# Patient Record
Sex: Male | Born: 1945 | Race: White | Hispanic: No | Marital: Married | Smoking: Former smoker
Health system: Southern US, Community
[De-identification: ages and names within clinical notes are randomized; demographics above are authoritative.]

## PROBLEM LIST (undated history)

## (undated) DIAGNOSIS — Z85820 Personal history of malignant melanoma of skin: Secondary | ICD-10-CM

## (undated) DIAGNOSIS — R51 Headache: Secondary | ICD-10-CM

## (undated) DIAGNOSIS — Z9889 Other specified postprocedural states: Secondary | ICD-10-CM

## (undated) DIAGNOSIS — R519 Headache, unspecified: Secondary | ICD-10-CM

---

## 1978-10-24 HISTORY — PX: MELANOMA EXCISION: SHX5266

## 1978-10-24 HISTORY — PX: SKIN GRAFT: SHX250

## 1998-10-24 HISTORY — PX: HERNIA REPAIR: SHX51

## 2010-10-25 ENCOUNTER — Inpatient Hospital Stay (HOSPITAL_COMMUNITY)
Admission: RE | Admit: 2010-10-25 | Discharge: 2010-10-28 | Payer: Self-pay | Source: Home / Self Care | Attending: Orthopedic Surgery | Admitting: Orthopedic Surgery

## 2010-10-27 LAB — CBC
HCT: 38.3 % — ABNORMAL LOW (ref 39.0–52.0)
Hemoglobin: 12.7 g/dL — ABNORMAL LOW (ref 13.0–17.0)
MCH: 30.6 pg (ref 26.0–34.0)
MCHC: 33.2 g/dL (ref 30.0–36.0)
MCV: 92.3 fL (ref 78.0–100.0)
Platelets: 220 10*3/uL (ref 150–400)
RBC: 4.15 MIL/uL — ABNORMAL LOW (ref 4.22–5.81)
RDW: 13 % (ref 11.5–15.5)
WBC: 16.7 10*3/uL — ABNORMAL HIGH (ref 4.0–10.5)

## 2010-10-27 LAB — BASIC METABOLIC PANEL
BUN: 9 mg/dL (ref 6–23)
CO2: 26 mEq/L (ref 19–32)
Calcium: 8.6 mg/dL (ref 8.4–10.5)
Chloride: 100 mEq/L (ref 96–112)
Creatinine, Ser: 0.97 mg/dL (ref 0.4–1.5)
GFR calc Af Amer: >60 mL/min (ref >60)
GFR calc non Af Amer: >60 mL/min (ref >60)
Glucose, Bld: 167 mg/dL — ABNORMAL HIGH (ref 70–99)
Potassium: 4.1 mEq/L (ref 3.5–5.1)
Sodium: 133 mEq/L — ABNORMAL LOW (ref 135–145)

## 2010-10-28 LAB — CBC
HCT: 36.3 % — ABNORMAL LOW (ref 39.0–52.0)
Hemoglobin: 12.2 g/dL — ABNORMAL LOW (ref 13.0–17.0)
MCH: 31.2 pg (ref 26.0–34.0)
MCHC: 33.6 g/dL (ref 30.0–36.0)
MCV: 92.8 fL (ref 78.0–100.0)
Platelets: 229 10*3/uL (ref 150–400)
RBC: 3.91 MIL/uL — ABNORMAL LOW (ref 4.22–5.81)
RDW: 13.1 % (ref 11.5–15.5)
WBC: 15.1 10*3/uL — ABNORMAL HIGH (ref 4.0–10.5)

## 2010-10-28 LAB — BASIC METABOLIC PANEL
BUN: 14 mg/dL (ref 6–23)
CO2: 29 mEq/L (ref 19–32)
Calcium: 8.9 mg/dL (ref 8.4–10.5)
Chloride: 98 mEq/L (ref 96–112)
Creatinine, Ser: 0.95 mg/dL (ref 0.4–1.5)
GFR calc Af Amer: >60 mL/min (ref >60)
GFR calc non Af Amer: >60 mL/min (ref >60)
Glucose, Bld: 154 mg/dL — ABNORMAL HIGH (ref 70–99)
Potassium: 4 mEq/L (ref 3.5–5.1)
Sodium: 136 mEq/L (ref 135–145)

## 2010-11-24 NOTE — Discharge Summary (Signed)
NAME:  Joshua Hughes, Joshua Hughes               ACCOUNT NO.:  [id number]  MEDICAL RECORD NO.:  [id number]          PATIENT TYPE:  INP  LOCATION:  1620                         FACILITY:  [facility]  PHYSICIAN:  Ollen Gross, M.D.    DATE OF BIRTH:  Dec 08, 1945  DATE OF ADMISSION:  10/25/2010 DATE OF DISCHARGE:  10/28/2010                              DISCHARGE SUMMARY   ADMITTING DIAGNOSES: 1. Osteoarthritis, right greater than left knee. 2. Hyperlipidemia. 3. Left arm melanoma. 4. Childhood illnesses of measles and mumps.  DISCHARGE DIAGNOSES: 1. Osteoarthritis of bilateral knees status post right total knee     replacement arthroplasty with a left knee cortisone injection. 2. Postop hyponatremia, improved. 3. Remaining discharge diagnoses same as admitting diagnoses.  PROCEDURE:  October 25, 2010: Right total knee with a left knee cortisone injection.  Surgeon: Dr. Lequita Halt.  Assistant: Avel Peace, PA-C. Spinal anesthesia.  Tourniquet time: 37 minutes.  CONSULTS:  None.  BRIEF HISTORY:  The patient is 65 year old male with end-stage arthritis and right greater than left knee pain with worsening pain and dysfunction who failed other management and now presents for a total knee arthroplasty.  LABORATORY DATA:  Preop CBC showed a hemoglobin of 16, hematocrit of 47.7, white cell count of 7.7, platelets 249.  Postop hemoglobin 13.2 and 12.7.  Last H&H 12.2 and 36.9.  PT/INR 13.8 and 1.04 with PTT of 32. Chemistry panel on admission all within normal limits.  Serial BMETs were followed.  Sodium did drop from 142 down to 133.  It was back up to 136.  The remaining electrolytes remained within normal limits.  Preop UA negative.  Blood group type A+.  Nasal swabs were negative for Staphylococcus aureus and negative for MRSA.  Two-view chest on October 14, 2010 no acute cardiopulmonary disease.  HOSPITAL COURSE:  The patient was admitted to [facility], taken to the OR and underwent  above-stated procedure without any complication.  The patient tolerated the procedure well, and later transferred to recovery room on orthopedic floor and started on p.o. and IV analgesic pain control following surgery.  Actually was doing fairly well on the morning of day one and was able to get some sleep. Hemoglobin looked good.  I put him on Xarelto for DVT prophylaxis. Hemovac drain pulled without difficulty.  Started getting up out of bed on day one.  By day two, he was starting to progress with therapy, tolerating his medications.  Sodium was down a little bit and felt to be a dilutional component postoperatively.  He was diuresing his fluids well, and thus we allowed him to concentrate his sodium back up on his own.  He continued to progress well, and by day three, sodium was back up.  Hemoglobin was stable.  Incision looked good, tolerating his medications and was discharged home.  DISCHARGE/PLAN: 1. The patient was discharged home on October 28, 2010. 2. Discharge diagnoses:  Please see above. 3. Discharge medications:  Percocet, Robaxin, Xarelto, Crestor.  DIET:  Heart-healthy diet.  ACTIVITY:  He is weightbearing as tolerated on right lower extremity. PT home health nursing.  FOLLOW-UP:  2 weeks.  DISPOSITION:  Home.  CONDITION ON DISCHARGE:  Improved.     Joshua Hughes, P.A.C.   ______________________________ Ollen Gross, M.D.    ALP/MEDQ  D:  11/11/2010  T:  11/11/2010  Job:  [id number]  cc:   Lianne Bushy, M.D. Fax: [phone]  Electronically Signed by Patrica Duel P.A.C. on 11/12/2010 08:53:18 AM Electronically Signed by Ollen Gross M.D. on 11/24/2010 03:33:41 PM

## 2010-12-24 ENCOUNTER — Other Ambulatory Visit: Payer: Self-pay | Admitting: Orthopedic Surgery

## 2010-12-24 ENCOUNTER — Encounter (HOSPITAL_COMMUNITY): Payer: BC Managed Care – PPO

## 2010-12-24 DIAGNOSIS — Z01812 Encounter for preprocedural laboratory examination: Secondary | ICD-10-CM | POA: Insufficient documentation

## 2010-12-24 DIAGNOSIS — M25669 Stiffness of unspecified knee, not elsewhere classified: Secondary | ICD-10-CM | POA: Insufficient documentation

## 2010-12-24 LAB — COMPREHENSIVE METABOLIC PANEL
ALT: 21 U/L (ref 0–53)
AST: 21 U/L (ref 0–37)
Albumin: 3.9 g/dL (ref 3.5–5.2)
Alkaline Phosphatase: 59 U/L (ref 39–117)
BUN: 21 mg/dL (ref 6–23)
CO2: 28 mEq/L (ref 19–32)
Calcium: 9.5 mg/dL (ref 8.4–10.5)
Chloride: 105 mEq/L (ref 96–112)
Creatinine, Ser: 0.9 mg/dL (ref 0.4–1.5)
GFR calc Af Amer: >60 mL/min (ref >60)
GFR calc non Af Amer: >60 mL/min (ref >60)
Glucose, Bld: 152 mg/dL — ABNORMAL HIGH (ref 70–99)
Potassium: 3.8 mEq/L (ref 3.5–5.1)
Sodium: 142 mEq/L (ref 135–145)
Total Bilirubin: 0.8 mg/dL (ref 0.3–1.2)
Total Protein: 7.6 g/dL (ref 6.0–8.3)

## 2010-12-24 LAB — PROTIME-INR
INR: 1.04 (ref 0.00–1.49)
Prothrombin Time: 13.8 seconds (ref 11.6–15.2)

## 2010-12-24 LAB — URINALYSIS, ROUTINE W REFLEX MICROSCOPIC
Bilirubin Urine: NEGATIVE
Hgb urine dipstick: NEGATIVE
Ketones, ur: NEGATIVE mg/dL
Nitrite: NEGATIVE
Protein, ur: NEGATIVE mg/dL
Specific Gravity, Urine: 1.03 (ref 1.005–1.030)
Urine Glucose, Fasting: NEGATIVE mg/dL
Urobilinogen, UA: 0.2 mg/dL (ref 0.0–1.0)
pH: 5.5 (ref 5.0–8.0)

## 2010-12-24 LAB — CBC
HCT: 44.9 % (ref 39.0–52.0)
Hemoglobin: 14.8 g/dL (ref 13.0–17.0)
MCH: 30.3 pg (ref 26.0–34.0)
MCHC: 33 g/dL (ref 30.0–36.0)
MCV: 92 fL (ref 78.0–100.0)
Platelets: 275 10*3/uL (ref 150–400)
RBC: 4.88 MIL/uL (ref 4.22–5.81)
RDW: 13 % (ref 11.5–15.5)
WBC: 8.7 10*3/uL (ref 4.0–10.5)

## 2010-12-24 LAB — SURGICAL PCR SCREEN
MRSA, PCR: NEGATIVE
Staphylococcus aureus: NEGATIVE

## 2010-12-24 LAB — APTT: aPTT: 30 seconds (ref 24–37)

## 2010-12-29 ENCOUNTER — Ambulatory Visit (HOSPITAL_COMMUNITY)
Admission: RE | Admit: 2010-12-29 | Discharge: 2010-12-29 | Disposition: A | Discharge status: Home / Self Care | Payer: BC Managed Care – PPO | Source: Ambulatory Visit | Attending: Orthopedic Surgery | Admitting: Orthopedic Surgery

## 2010-12-29 DIAGNOSIS — Z01818 Encounter for other preprocedural examination: Secondary | ICD-10-CM | POA: Insufficient documentation

## 2010-12-29 DIAGNOSIS — Z96659 Presence of unspecified artificial knee joint: Secondary | ICD-10-CM | POA: Insufficient documentation

## 2010-12-29 DIAGNOSIS — M24669 Ankylosis, unspecified knee: Secondary | ICD-10-CM | POA: Insufficient documentation

## 2010-12-29 DIAGNOSIS — Z01812 Encounter for preprocedural laboratory examination: Secondary | ICD-10-CM | POA: Insufficient documentation

## 2010-12-29 DIAGNOSIS — M2469 Ankylosis, other specified joint: Secondary | ICD-10-CM | POA: Insufficient documentation

## 2010-12-30 NOTE — Op Note (Signed)
  NAME:  Joshua Hughes, Joshua Hughes               ACCOUNT NO.:  [id number]  MEDICAL RECORD NO.:  [id number]           PATIENT TYPE:  O  LOCATION:  DAYL                         [facility]ACILITY:  [facility]  PHYSICIAN:  Ollen Gross, M.D.    DATE OF BIRTH:  [birth year]  DATE OF PROCEDURE:  12/29/2010 DATE OF DISCHARGE:                              OPERATIVE REPORT   PREOPERATIVE DIAGNOSIS:  Right knee arthrofibrosis.  POSTOPERATIVE DIAGNOSIS:  Right knee arthrofibrosis.  PROCEDURE:  Right knee closed manipulation.  SURGEON:  Ollen Gross, M.D.  ASSISTANT.:  None.  ANESTHESIA:  General.  Premanipulation range of motion 0 to 90.  Post manipulation range of motion 0 to 125.  COMPLICATIONS:  None.  CONDITION.:  Stable to recovery.  BRIEF CLINICAL NOTE:  [name] is a 65 year old male who had right total knee arthroplasty done approximately 2 months ago.  He has been progressing very slowly with range of motion and has not had any improvement in the past month.  He has been stuck at 90 degrees despite efforts to improve this with physical therapy.  He presents now for closed manipulation.  PROCEDURE IN DETAIL:  After successful administration of general anesthetic, exam under anesthesia was performed, range of motion 0 to 90.  I then pushed my chest up against his proximal tibia, flexing the knee with audible lysis of adhesions.  Was easily able to achieve 125 degrees of flexion.  Full extension was also maintained.  I flexed him again and he made it to 125.  He was then awakened and transported to recovery in stable condition.     Ollen Gross, M.D.     FA/MEDQ  D:  12/29/2010  T:  12/29/2010  Job:  [id number]  Electronically Signed by Ollen Gross M.D. on 12/29/2010 03:52:06 PM

## 2011-01-03 LAB — CBC
HCT: 39.7 % (ref 39.0–52.0)
HCT: 47.7 % (ref 39.0–52.0)
Hemoglobin: 13.2 g/dL (ref 13.0–17.0)
Hemoglobin: 16 g/dL (ref 13.0–17.0)
MCH: 30.7 pg (ref 26.0–34.0)
MCH: 31.2 pg (ref 26.0–34.0)
MCHC: 33.2 g/dL (ref 30.0–36.0)
MCHC: 33.5 g/dL (ref 30.0–36.0)
MCV: 92.3 fL (ref 78.0–100.0)
MCV: 93 fL (ref 78.0–100.0)
Platelets: 234 10*3/uL (ref 150–400)
Platelets: 249 10*3/uL (ref 150–400)
RBC: 4.3 MIL/uL (ref 4.22–5.81)
RBC: 5.13 MIL/uL (ref 4.22–5.81)
RDW: 12.9 % (ref 11.5–15.5)
RDW: 12.9 % (ref 11.5–15.5)
WBC: 13.4 10*3/uL — ABNORMAL HIGH (ref 4.0–10.5)
WBC: 7.7 10*3/uL (ref 4.0–10.5)

## 2011-01-03 LAB — URINALYSIS, ROUTINE W REFLEX MICROSCOPIC
Bilirubin Urine: NEGATIVE
Glucose, UA: NEGATIVE mg/dL
Hgb urine dipstick: NEGATIVE
Ketones, ur: NEGATIVE mg/dL
Nitrite: NEGATIVE
Protein, ur: NEGATIVE mg/dL
Specific Gravity, Urine: 1.018 (ref 1.005–1.030)
Urobilinogen, UA: 0.2 mg/dL (ref 0.0–1.0)
pH: 5.5 (ref 5.0–8.0)

## 2011-01-03 LAB — COMPREHENSIVE METABOLIC PANEL
ALT: 22 U/L (ref 0–53)
AST: 22 U/L (ref 0–37)
Albumin: 4.1 g/dL (ref 3.5–5.2)
Alkaline Phosphatase: 64 U/L (ref 39–117)
BUN: 20 mg/dL (ref 6–23)
CO2: 29 mEq/L (ref 19–32)
Calcium: 9.6 mg/dL (ref 8.4–10.5)
Chloride: 105 mEq/L (ref 96–112)
Creatinine, Ser: 0.91 mg/dL (ref 0.4–1.5)
GFR calc Af Amer: >60 mL/min (ref >60)
GFR calc non Af Amer: >60 mL/min (ref >60)
Glucose, Bld: 117 mg/dL — ABNORMAL HIGH (ref 70–99)
Potassium: 4.7 mEq/L (ref 3.5–5.1)
Sodium: 142 mEq/L (ref 135–145)
Total Bilirubin: 1 mg/dL (ref 0.3–1.2)
Total Protein: 7.6 g/dL (ref 6.0–8.3)

## 2011-01-03 LAB — SURGICAL PCR SCREEN
MRSA, PCR: NEGATIVE
Staphylococcus aureus: NEGATIVE

## 2011-01-03 LAB — BASIC METABOLIC PANEL
BUN: 11 mg/dL (ref 6–23)
CO2: 29 mEq/L (ref 19–32)
Calcium: 8.2 mg/dL — ABNORMAL LOW (ref 8.4–10.5)
Chloride: 98 mEq/L (ref 96–112)
Creatinine, Ser: 0.99 mg/dL (ref 0.4–1.5)
GFR calc Af Amer: >60 mL/min (ref >60)
GFR calc non Af Amer: >60 mL/min (ref >60)
Glucose, Bld: 156 mg/dL — ABNORMAL HIGH (ref 70–99)
Potassium: 4.5 mEq/L (ref 3.5–5.1)
Sodium: 134 mEq/L — ABNORMAL LOW (ref 135–145)

## 2011-01-03 LAB — TYPE AND SCREEN
ABO/RH(D): A POS
Antibody Screen: NEGATIVE

## 2011-01-03 LAB — APTT: aPTT: 32 seconds (ref 24–37)

## 2011-01-03 LAB — PROTIME-INR
INR: 1.04 (ref 0.00–1.49)
Prothrombin Time: 13.8 seconds (ref 11.6–15.2)

## 2011-01-03 LAB — ABO/RH: ABO/RH(D): A POS

## 2011-10-25 HISTORY — PX: JOINT REPLACEMENT: SHX530

## 2012-07-22 IMAGING — CR DG CHEST 2V
2 series · 2 of 2 positions shown · non-contrast
Comparison: None

CLINICAL DATA: Osteoarthritis right knee.

CHEST - 2 VIEW

[w chest pa]
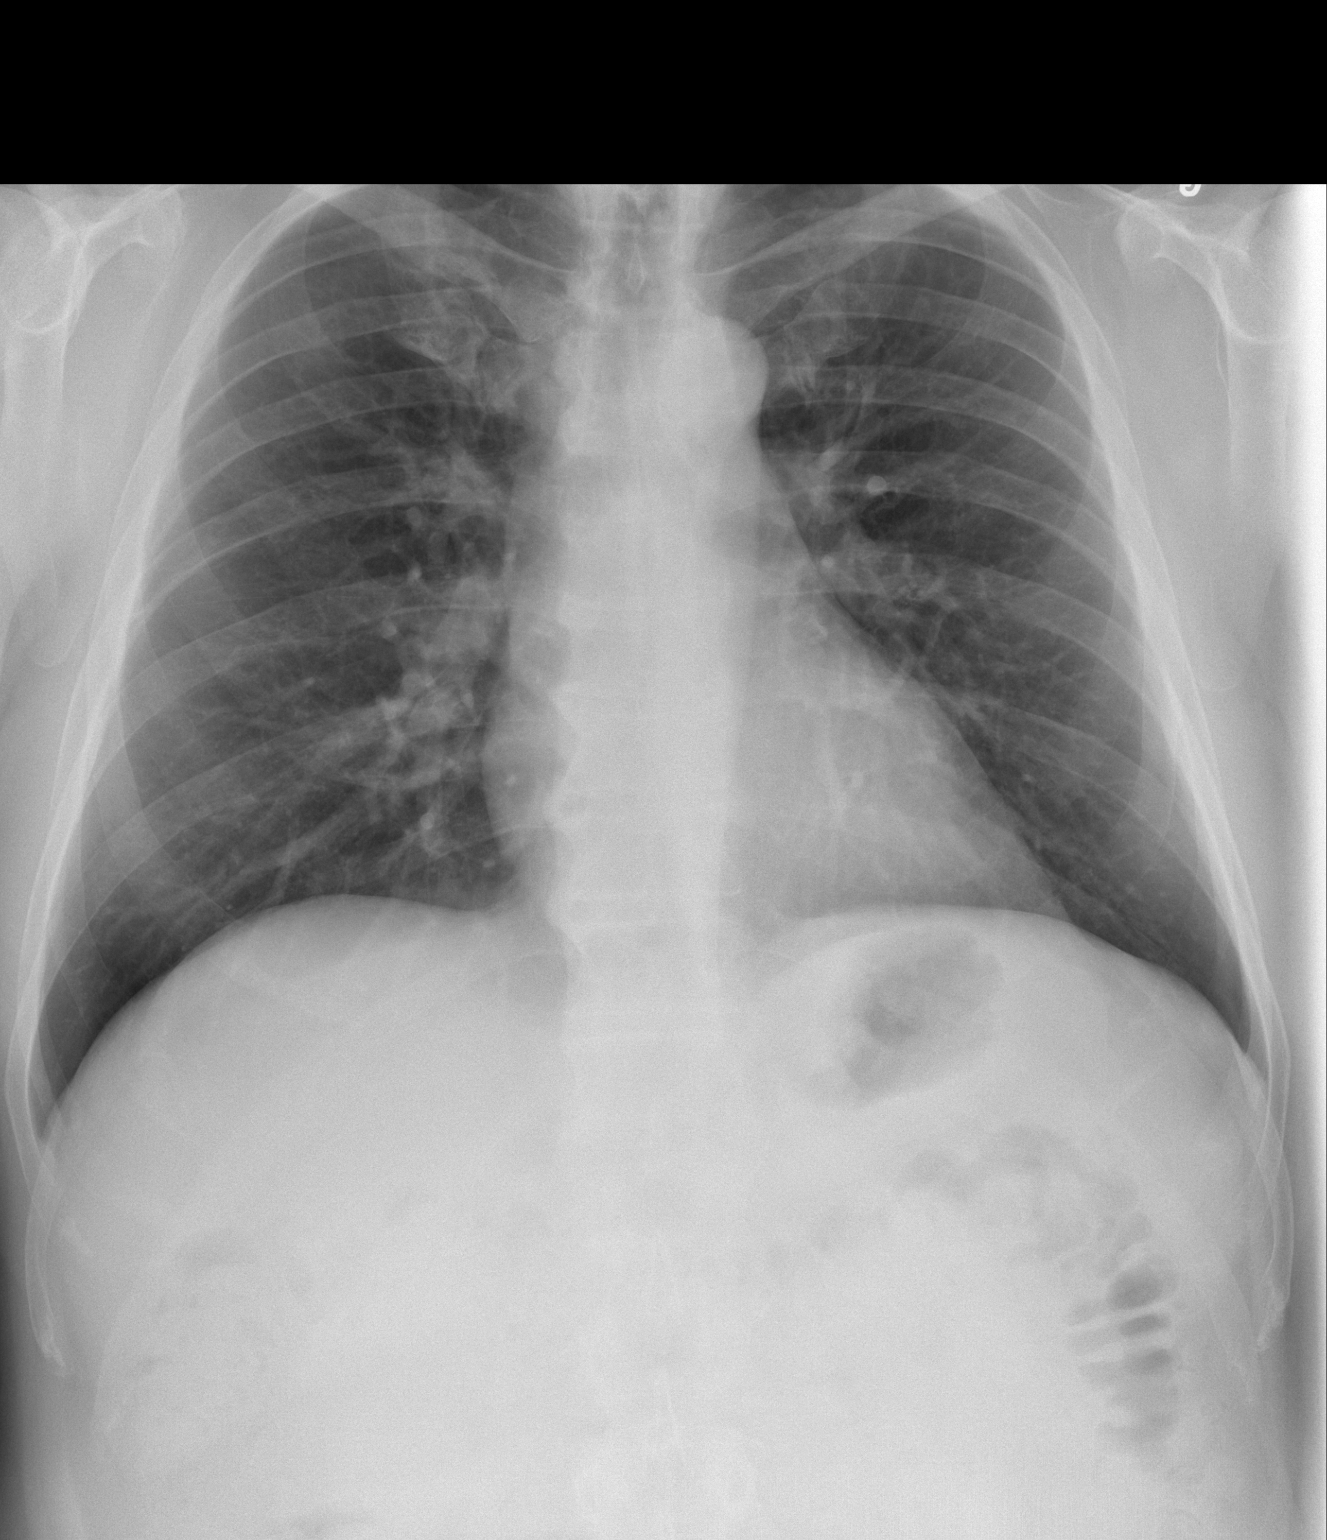

[w chest lat]
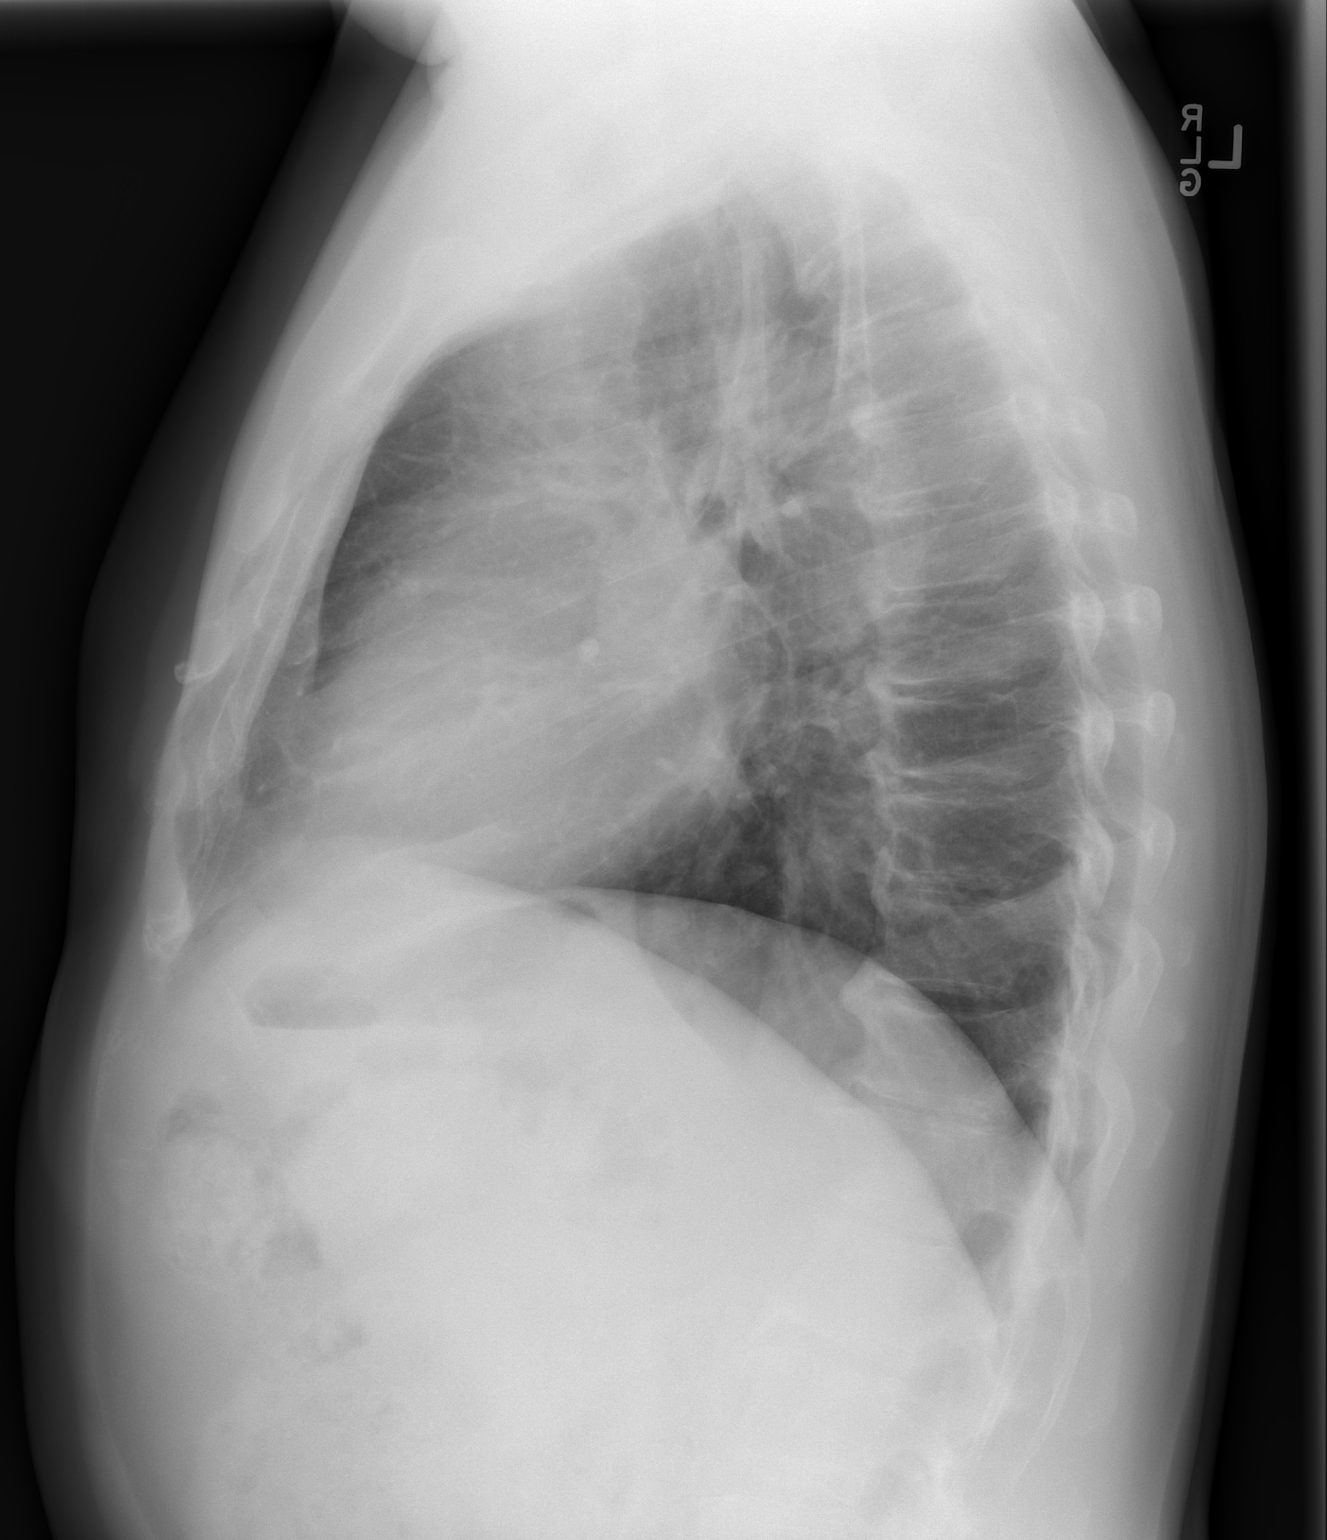

[2 of 2 positions shown; findings below may reference images not displayed]

FINDINGS: Heart size is normal.  Negative for heart failure.  The
lungs are clear without infiltrate or effusion.
IMPRESSION: No active cardiopulmonary disease.

## 2014-09-15 ENCOUNTER — Ambulatory Visit: Payer: Self-pay | Admitting: Orthopedic Surgery

## 2014-09-15 NOTE — Progress Notes (Signed)
Preoperative surgical orders have been place into the Epic hospital system for [facility] on 09/15/2014, 7:41 AM  by Mickel Crow for surgery on 11-19-2014.  Preop Knee Scope orders including Experal,  IV Tylenol and IV Decadron as long as there are no contraindications to the above medications. Arlee Muslim, PA-C

## 2014-11-13 ENCOUNTER — Encounter (HOSPITAL_COMMUNITY): Payer: Self-pay

## 2014-11-13 ENCOUNTER — Encounter (HOSPITAL_COMMUNITY)
Admission: RE | Admit: 2014-11-13 | Discharge: 2014-11-13 | Disposition: A | Discharge status: Home / Self Care | Payer: Medicare Other | Source: Ambulatory Visit | Attending: Orthopedic Surgery | Admitting: Orthopedic Surgery

## 2014-11-13 DIAGNOSIS — M179 Osteoarthritis of knee, unspecified: Secondary | ICD-10-CM | POA: Insufficient documentation

## 2014-11-13 DIAGNOSIS — M24661 Ankylosis, right knee: Secondary | ICD-10-CM | POA: Diagnosis present

## 2014-11-13 HISTORY — DX: Personal history of malignant melanoma of skin: Z85.820

## 2014-11-13 HISTORY — DX: Headache: R51

## 2014-11-13 HISTORY — DX: Other specified postprocedural states: Z98.890

## 2014-11-13 HISTORY — DX: Headache, unspecified: R51.9

## 2014-11-13 LAB — CBC
HCT: 47.4 % (ref 39.0–52.0)
Hemoglobin: 16.2 g/dL (ref 13.0–17.0)
MCH: 30.7 pg (ref 26.0–34.0)
MCHC: 34.2 g/dL (ref 30.0–36.0)
MCV: 89.8 fL (ref 78.0–100.0)
Platelets: 249 10*3/uL (ref 150–400)
RBC: 5.28 MIL/uL (ref 4.22–5.81)
RDW: 12.9 % (ref 11.5–15.5)
WBC: 7.7 10*3/uL (ref 4.0–10.5)

## 2014-11-13 LAB — SURGICAL PCR SCREEN
MRSA, PCR: NEGATIVE
Staphylococcus aureus: NEGATIVE

## 2014-11-13 NOTE — Progress Notes (Signed)
   11/13/14 0818  OBSTRUCTIVE SLEEP APNEA  Have you ever been diagnosed with sleep apnea through a sleep study? No  Do you snore loudly (loud enough to be heard through closed doors)?  1  Do you often feel tired, fatigued, or sleepy during the daytime? 0  Has anyone observed you stop breathing during your sleep? 0  Do you have, or are you being treated for high blood pressure? 0  BMI more than 35 kg/m2? 1  Age over 67 years old? 1  Neck circumference greater than 40 cm/16 inches? 0  Gender: 1  Obstructive Sleep Apnea Score 4  Score 4 or greater  Results sent to PCP

## 2014-11-13 NOTE — Patient Instructions (Addendum)
GAY RAPE  11/13/2014   Your procedure is scheduled on: Wednesday 11/19/14  Report to [facility] Main  Entrance and follow signs to               [place] at 2:30 PM.  Call this number if you have problems the morning of surgery [phone]   Remember:  Do not eat food :After Midnight. Clear liquids from midnight until 10:30 am on 11/19/14 then nothing.                                   You may not have any metal on your body including hair pins and              piercings  Do not wear jewelry, make-up, lotions, powders or perfumes.             Do not wear nail polish.  Do not shave  48 hours prior to surgery.              Men may shave face and neck.  Do not bring valuables to the hospital. [place].  Contacts, dentures or bridgework may not be worn into surgery.  Leave suitcase in the car. After surgery it may be brought to your room.               Please read over the following fact sheets you were given: MRSA information _____________________________________________________________________             [facility] - Preparing for Surgery Before surgery, you can play an important role.  Because skin is not sterile, your skin needs to be as free of germs as possible.  You can reduce the number of germs on your skin by washing with CHG (chlorahexidine gluconate) soap before surgery.  CHG is an antiseptic cleaner which kills germs and bonds with the skin to continue killing germs even after washing. Please DO NOT use if you have an allergy to CHG or antibacterial soaps.  If your skin becomes reddened/irritated stop using the CHG and inform your nurse when you arrive at Short Stay. Do not shave (including legs and underarms) for at least 48 hours prior to the first CHG shower.  You may shave your face/neck. Please follow these instructions carefully:  1.  Shower with CHG Soap the night before surgery  and the  morning of Surgery.  2.  If you choose to wash your hair, wash your hair first as usual with your  normal  shampoo.  3.  After you shampoo, rinse your hair and body thoroughly to remove the  shampoo.                            4.  Use CHG as you would any other liquid soap.  You can apply chg directly  to the skin and wash                       Gently with a scrungie or clean washcloth.  5.  Apply the CHG Soap to your body ONLY FROM THE NECK DOWN.   Do not use on face/ open  Wound or open sores. Avoid contact with eyes, ears mouth and genitals (private parts).                       Wash face,  Genitals (private parts) with your normal soap.             6.  Wash thoroughly, paying special attention to the area where your surgery  will be performed.  7.  Thoroughly rinse your body with warm water from the neck down.  8.  DO NOT shower/wash with your normal soap after using and rinsing off  the CHG Soap.                9.  Pat yourself dry with a clean towel.            10.  Wear clean pajamas.            11.  Place clean sheets on your bed the night of your first shower and do not  sleep with pets. Day of Surgery : Do not apply any lotions/deodorants the morning of surgery.  Please wear clean clothes to the hospital/surgery center.  FAILURE TO FOLLOW THESE INSTRUCTIONS MAY RESULT IN THE CANCELLATION OF YOUR SURGERY PATIENT SIGNATURE_________________________________  NURSE SIGNATURE__________________________________  ________________________________________________________________________   Joshua Hughes  An incentive spirometer is a tool that can help keep your lungs clear and active. This tool measures how well you are filling your lungs with each breath. Taking long deep breaths may help reverse or decrease the chance of developing breathing (pulmonary) problems (especially infection) following:  A long period of time when you are unable to move  or be active. BEFORE THE PROCEDURE   If the spirometer includes an indicator to show your best effort, your nurse or respiratory therapist will set it to a desired goal.  If possible, sit up straight or lean slightly forward. Try not to slouch.  Hold the incentive spirometer in an upright position. INSTRUCTIONS FOR USE  1. Sit on the edge of your bed if possible, or sit up as far as you can in bed or on a chair. 2. Hold the incentive spirometer in an upright position. 3. Breathe out normally. 4. Place the mouthpiece in your mouth and seal your lips tightly around it. 5. Breathe in slowly and as deeply as possible, raising the piston or the ball toward the top of the column. 6. Hold your breath for 3-5 seconds or for as long as possible. Allow the piston or ball to fall to the bottom of the column. 7. Remove the mouthpiece from your mouth and breathe out normally. 8. Rest for a few seconds and repeat Steps 1 through 7 at least 10 times every 1-2 hours when you are awake. Take your time and take a few normal breaths between deep breaths. 9. The spirometer may include an indicator to show your best effort. Use the indicator as a goal to work toward during each repetition. 10. After each set of 10 deep breaths, practice coughing to be sure your lungs are clear. If you have an incision (the cut made at the time of surgery), support your incision when coughing by placing a pillow or rolled up towels firmly against it. Once you are able to get out of bed, walk around indoors and cough well. You may stop using the incentive spirometer when instructed by your caregiver.  RISKS AND COMPLICATIONS  Take your time so you do not get  dizzy or light-headed.  If you are in pain, you may need to take or ask for pain medication before doing incentive spirometry. It is harder to take a deep breath if you are having pain. AFTER USE  Rest and breathe slowly and easily.  It can be helpful to keep track of a  log of your progress. Your caregiver can provide you with a simple table to help with this. If you are using the spirometer at home, follow these instructions: [place] IF:   You are having difficultly using the spirometer.  You have trouble using the spirometer as often as instructed.  Your pain medication is not giving enough relief while using the spirometer.  You develop fever of 100.5 F (38.1 C) or higher. SEEK IMMEDIATE MEDICAL CARE IF:   You cough up bloody sputum that had not been present before.  You develop fever of 102 F (38.9 C) or greater.  You develop worsening pain at or near the incision site. MAKE SURE YOU:   Understand these instructions.  Will watch your condition.  Will get help right away if you are not doing well or get worse. Document Released: 02/20/2007 Document Revised: 01/02/2012 Document Reviewed: 04/23/2007 ExitCare Patient Information 2014 [place].   ________________________________________________________________________   CLEAR LIQUID DIET   Foods Allowed                                                                     Foods Excluded  Coffee and tea, regular and decaf                             liquids that you cannot  Plain Jell-O in any flavor                                             see through such as: Fruit ices (not with fruit pulp)                                     milk, soups, orange juice  Iced Popsicles                                    All solid food Carbonated beverages, regular and diet                                    Cranberry, grape and apple juices Sports drinks like Gatorade Lightly seasoned clear broth or consume(fat free) Sugar, honey syrup  Sample Menu Breakfast                                Lunch  Supper Cranberry juice                    Beef broth                            Chicken broth Jell-O                                     Grape juice                            Apple juice Coffee or tea                        Jell-O                                      Popsicle                                                Coffee or tea                        Coffee or tea  _____________________________________________________________________

## 2014-11-18 DIAGNOSIS — M25669 Stiffness of unspecified knee, not elsewhere classified: Secondary | ICD-10-CM | POA: Diagnosis present

## 2014-11-18 DIAGNOSIS — Z96659 Presence of unspecified artificial knee joint: Secondary | ICD-10-CM

## 2014-11-18 DIAGNOSIS — T8489XA Other specified complication of internal orthopedic prosthetic devices, implants and grafts, initial encounter: Secondary | ICD-10-CM

## 2014-11-18 MED ORDER — DEXTROSE 5 % IV SOLN
3.0000 g | INTRAVENOUS | Status: AC
  Administered 2014-11-19: 3 g via INTRAVENOUS
  Filled 2014-11-18 (×2): qty 3000

## 2014-11-18 NOTE — H&P (Signed)
  CC- Joshua Hughes is a 69 y.o. male who presents with right knee stiffness.  HPI- . Knee Pain: Patient presents for follow up on a knee problem involving the  right knee. Onset of the symptoms was several years ago. Inciting event: He had a right total knee arthroplasty approximately 4 years ago and had post-operative stiffness requiring manipulationn. He was unable to do adequate PT post-operatively and developed stiffness again. He presented to the office 2 months ago with a severely stiff knee. His prosthesis had no sgns of loosening or failure. We discussed tretment options and he presents now for arthrotomy and scar excision with possible component  revision if vecessary. Current symptoms include stiffness.  Past Medical History  Diagnosis Date  . Headache     occasional  . H/O melanoma excision     Past Surgical History  Procedure Laterality Date  . Joint replacement Right 10/25/11    knee  . Hernia repair  2000  . Melanoma excision Left 1980    arm-skin graft  . Skin graft  1980    from hip to left arm for melanoma surgery    Prior to Admission medications   Medication Sig Start Date End Date Taking? Authorizing Provider  aspirin EC 81 MG tablet Take 81 mg by mouth daily.   Yes Historical Provider, MD  Omega-3 Fatty Acids (FISH OIL) 1000 MG CAPS Take 4,000 mg by mouth at bedtime.   Yes Historical Provider, MD   RIGHT KNEE EXAM antalgic gait, no effusion swelling or warmth, reduced range of motion (5-75 degrees), collateral ligaments intact  Physical Examination: General appearance - alert, well appearing, and in no distress Mental status - alert, oriented to person, place, and time Chest - clear to auscultation, no wheezes, rales or rhonchi, symmetric air entry Heart - normal rate, regular rhythm, normal S1, S2, no murmurs, rubs, clicks or gallops Abdomen - soft, nontender, nondistended, no masses or organomegaly Neurological - alert, oriented, normal speech, no focal  findings or movement disorder noted    Asessment/Plan--- Right knee arthrofibrosis- - Plan right knee arthrotomy with scar excision and possible component revision. Procedure risks and potential comps discussed with patient who elects to proceed. Goals are decreased pain and increased function with a high likelihood of achieving both

## 2014-11-19 ENCOUNTER — Ambulatory Visit (HOSPITAL_COMMUNITY): Payer: Medicare Other | Admitting: Certified Registered"

## 2014-11-19 ENCOUNTER — Encounter (HOSPITAL_COMMUNITY): Payer: Self-pay | Admitting: Anesthesiology

## 2014-11-19 ENCOUNTER — Observation Stay (HOSPITAL_COMMUNITY)
Admission: RE | Admit: 2014-11-19 | Discharge: 2014-11-20 | Disposition: A | Discharge status: Home / Self Care | Payer: Medicare Other | Source: Ambulatory Visit | Attending: Orthopedic Surgery | Admitting: Orthopedic Surgery

## 2014-11-19 ENCOUNTER — Encounter (HOSPITAL_COMMUNITY)
Admission: RE | Disposition: A | Discharge status: Home / Self Care | Payer: Self-pay | Source: Ambulatory Visit | Attending: Orthopedic Surgery

## 2014-11-19 DIAGNOSIS — M24661 Ankylosis, right knee: Secondary | ICD-10-CM | POA: Diagnosis present

## 2014-11-19 DIAGNOSIS — M25669 Stiffness of unspecified knee, not elsewhere classified: Secondary | ICD-10-CM | POA: Diagnosis present

## 2014-11-19 DIAGNOSIS — M1712 Unilateral primary osteoarthritis, left knee: Secondary | ICD-10-CM | POA: Diagnosis not present

## 2014-11-19 DIAGNOSIS — M25661 Stiffness of right knee, not elsewhere classified: Secondary | ICD-10-CM | POA: Diagnosis not present

## 2014-11-19 DIAGNOSIS — Z7982 Long term (current) use of aspirin: Secondary | ICD-10-CM | POA: Insufficient documentation

## 2014-11-19 DIAGNOSIS — Z96659 Presence of unspecified artificial knee joint: Secondary | ICD-10-CM

## 2014-11-19 DIAGNOSIS — Z85820 Personal history of malignant melanoma of skin: Secondary | ICD-10-CM | POA: Insufficient documentation

## 2014-11-19 DIAGNOSIS — Z6836 Body mass index (BMI) 36.0-36.9, adult: Secondary | ICD-10-CM | POA: Insufficient documentation

## 2014-11-19 DIAGNOSIS — E669 Obesity, unspecified: Secondary | ICD-10-CM | POA: Diagnosis not present

## 2014-11-19 DIAGNOSIS — T8489XA Other specified complication of internal orthopedic prosthetic devices, implants and grafts, initial encounter: Secondary | ICD-10-CM

## 2014-11-19 DIAGNOSIS — Z87891 Personal history of nicotine dependence: Secondary | ICD-10-CM | POA: Diagnosis not present

## 2014-11-19 HISTORY — PX: KNEE ARTHROTOMY: SHX5881

## 2014-11-19 SURGERY — ARTHROTOMY, KNEE
Anesthesia: General | Site: Knee | Laterality: Right

## 2014-11-19 MED ORDER — OXYCODONE HCL 5 MG PO TABS
5.0000 mg | ORAL_TABLET | ORAL | Status: DC | PRN
  Administered 2014-11-19 – 2014-11-20 (×4): 10 mg via ORAL
  Filled 2014-11-19 (×4): qty 2

## 2014-11-19 MED ORDER — METHOCARBAMOL 500 MG PO TABS
500.0000 mg | ORAL_TABLET | Freq: Four times a day (QID) | ORAL | Status: DC | PRN
Start: 2014-11-19 — End: 2014-11-20

## 2014-11-19 MED ORDER — METOCLOPRAMIDE HCL 5 MG/ML IJ SOLN: 5.0000 mg | Freq: Three times a day (TID) | INTRAMUSCULAR | Status: DC | PRN

## 2014-11-19 MED ORDER — MORPHINE SULFATE 2 MG/ML IJ SOLN
1.0000 mg | INTRAMUSCULAR | Status: DC | PRN
  Administered 2014-11-19: 1 mg via INTRAVENOUS
  Filled 2014-11-19: qty 1

## 2014-11-19 MED ORDER — ENOXAPARIN SODIUM 40 MG/0.4ML ~~LOC~~ SOLN
40.0000 mg | SUBCUTANEOUS | Status: DC
  Administered 2014-11-20: 40 mg via SUBCUTANEOUS
  Filled 2014-11-19 (×2): qty 0.4

## 2014-11-19 MED ORDER — ONDANSETRON HCL 4 MG/2ML IJ SOLN
INTRAMUSCULAR | Status: DC | PRN
  Administered 2014-11-19: 4 mg via INTRAVENOUS

## 2014-11-19 MED ORDER — DEXAMETHASONE SODIUM PHOSPHATE 10 MG/ML IJ SOLN
INTRAMUSCULAR | Status: AC
  Filled 2014-11-19: qty 1

## 2014-11-19 MED ORDER — ONDANSETRON HCL 4 MG/2ML IJ SOLN
4.0000 mg | Freq: Four times a day (QID) | INTRAMUSCULAR | Status: DC | PRN
  Administered 2014-11-19: 4 mg via INTRAVENOUS
  Filled 2014-11-19: qty 2

## 2014-11-19 MED ORDER — SODIUM CHLORIDE 0.9 % IV SOLN: INTRAVENOUS | Status: DC

## 2014-11-19 MED ORDER — MIDAZOLAM HCL 5 MG/5ML IJ SOLN
INTRAMUSCULAR | Status: DC | PRN
  Administered 2014-11-19: 2 mg via INTRAVENOUS

## 2014-11-19 MED ORDER — CEFAZOLIN SODIUM-DEXTROSE 2-3 GM-% IV SOLR
2.0000 g | Freq: Four times a day (QID) | INTRAVENOUS | Status: AC
  Administered 2014-11-19 – 2014-11-20 (×3): 2 g via INTRAVENOUS
  Filled 2014-11-19 (×3): qty 50

## 2014-11-19 MED ORDER — METOCLOPRAMIDE HCL 10 MG PO TABS: 5.0000 mg | ORAL_TABLET | Freq: Three times a day (TID) | ORAL | Status: DC | PRN

## 2014-11-19 MED ORDER — METHYLPREDNISOLONE ACETATE 80 MG/ML IJ SUSP
INTRAMUSCULAR | Status: DC | PRN
  Administered 2014-11-19: 80 mg

## 2014-11-19 MED ORDER — DEXTROSE 5 % IV SOLN
500.0000 mg | Freq: Four times a day (QID) | INTRAVENOUS | Status: DC | PRN
  Administered 2014-11-19: 500 mg via INTRAVENOUS
  Filled 2014-11-19 (×2): qty 5

## 2014-11-19 MED ORDER — ONDANSETRON HCL 4 MG/2ML IJ SOLN
INTRAMUSCULAR | Status: AC
  Filled 2014-11-19: qty 2

## 2014-11-19 MED ORDER — LIDOCAINE HCL (CARDIAC) 20 MG/ML IV SOLN
INTRAVENOUS | Status: AC
  Filled 2014-11-19: qty 5

## 2014-11-19 MED ORDER — BUPIVACAINE LIPOSOME 1.3 % IJ SUSP
20.0000 mL | Freq: Once | INTRAMUSCULAR | Status: DC
  Filled 2014-11-19: qty 20

## 2014-11-19 MED ORDER — METHYLPREDNISOLONE ACETATE 40 MG/ML IJ SUSP
INTRAMUSCULAR | Status: AC
  Filled 2014-11-19: qty 2

## 2014-11-19 MED ORDER — ONDANSETRON HCL 4 MG PO TABS
4.0000 mg | ORAL_TABLET | Freq: Four times a day (QID) | ORAL | Status: DC | PRN
Start: 2014-11-19 — End: 2014-11-20

## 2014-11-19 MED ORDER — SODIUM CHLORIDE 0.9 % IV SOLN
INTRAVENOUS | Status: DC
  Administered 2014-11-19: 19:00:00 via INTRAVENOUS

## 2014-11-19 MED ORDER — ACETAMINOPHEN 10 MG/ML IV SOLN
1000.0000 mg | Freq: Once | INTRAVENOUS | Status: AC
  Administered 2014-11-19: 1000 mg via INTRAVENOUS
  Filled 2014-11-19 (×2): qty 100

## 2014-11-19 MED ORDER — FENTANYL CITRATE 0.05 MG/ML IJ SOLN
INTRAMUSCULAR | Status: DC | PRN
  Administered 2014-11-19 (×5): 50 ug via INTRAVENOUS

## 2014-11-19 MED ORDER — LIDOCAINE HCL 1 % IJ SOLN
INTRAMUSCULAR | Status: DC | PRN
  Administered 2014-11-19: 5 mL

## 2014-11-19 MED ORDER — DEXAMETHASONE SODIUM PHOSPHATE 10 MG/ML IJ SOLN
INTRAMUSCULAR | Status: DC | PRN
  Administered 2014-11-19: 10 mg via INTRAVENOUS

## 2014-11-19 MED ORDER — PROPOFOL 10 MG/ML IV BOLUS
INTRAVENOUS | Status: AC
  Filled 2014-11-19: qty 20

## 2014-11-19 MED ORDER — LIDOCAINE HCL 1 % IJ SOLN
INTRAMUSCULAR | Status: AC
  Filled 2014-11-19: qty 20

## 2014-11-19 MED ORDER — PROMETHAZINE HCL 25 MG/ML IJ SOLN: 6.2500 mg | INTRAMUSCULAR | Status: DC | PRN

## 2014-11-19 MED ORDER — FENTANYL CITRATE 0.05 MG/ML IJ SOLN
INTRAMUSCULAR | Status: AC
  Filled 2014-11-19: qty 5

## 2014-11-19 MED ORDER — HYDROMORPHONE HCL 1 MG/ML IJ SOLN
0.2500 mg | INTRAMUSCULAR | Status: DC | PRN
  Administered 2014-11-19: 0.5 mg via INTRAVENOUS
  Administered 2014-11-19: 0.25 mg via INTRAVENOUS

## 2014-11-19 MED ORDER — LIDOCAINE HCL (PF) 2 % IJ SOLN
INTRAMUSCULAR | Status: DC | PRN
  Administered 2014-11-19: 40 mg via INTRADERMAL

## 2014-11-19 MED ORDER — HYDROMORPHONE HCL 1 MG/ML IJ SOLN
INTRAMUSCULAR | Status: AC
  Filled 2014-11-19: qty 1

## 2014-11-19 MED ORDER — KETOROLAC TROMETHAMINE 30 MG/ML IJ SOLN
INTRAMUSCULAR | Status: DC | PRN
  Administered 2014-11-19: 30 mg via INTRAVENOUS
  Administered 2014-11-19: 30 mg via INTRAMUSCULAR

## 2014-11-19 MED ORDER — LACTATED RINGERS IV SOLN
INTRAVENOUS | Status: DC
  Administered 2014-11-19: 1000 mL via INTRAVENOUS

## 2014-11-19 MED ORDER — PROPOFOL 10 MG/ML IV BOLUS
INTRAVENOUS | Status: DC | PRN
  Administered 2014-11-19: 150 mg via INTRAVENOUS

## 2014-11-19 MED ORDER — DEXAMETHASONE SODIUM PHOSPHATE 10 MG/ML IJ SOLN: 10.0000 mg | Freq: Once | INTRAMUSCULAR | Status: DC

## 2014-11-19 MED ORDER — MIDAZOLAM HCL 2 MG/2ML IJ SOLN
INTRAMUSCULAR | Status: AC
  Filled 2014-11-19: qty 2

## 2014-11-19 SURGICAL SUPPLY — 33 items
BAG ZIPLOCK 12X15 (MISCELLANEOUS) ×3 IMPLANT
BANDAGE ELASTIC 6 VELCRO ST LF (GAUZE/BANDAGES/DRESSINGS) ×3 IMPLANT
BANDAGE ESMARK 6X9 LF (GAUZE/BANDAGES/DRESSINGS) ×1 IMPLANT
BNDG ESMARK 6X9 LF (GAUZE/BANDAGES/DRESSINGS) ×3
CLOSURE WOUND 1/2 X4 (GAUZE/BANDAGES/DRESSINGS) ×1
CUFF TOURN SGL QUICK 34 (TOURNIQUET CUFF)
CUFF TRNQT CYL 34X4X40X1 (TOURNIQUET CUFF) IMPLANT
DRAPE EXTREMITY T 121X128X90 (DRAPE) ×3 IMPLANT
DRAPE U-SHAPE 47X51 STRL (DRAPES) ×3 IMPLANT
DRSG ADAPTIC 3X8 NADH LF (GAUZE/BANDAGES/DRESSINGS) ×3 IMPLANT
DRSG PAD ABDOMINAL 8X10 ST (GAUZE/BANDAGES/DRESSINGS) ×3 IMPLANT
DURAPREP 26ML APPLICATOR (WOUND CARE) ×3 IMPLANT
ELECT REM PT RETURN 9FT ADLT (ELECTROSURGICAL) ×3
ELECTRODE REM PT RTRN 9FT ADLT (ELECTROSURGICAL) ×1 IMPLANT
EVACUATOR 1/8 PVC DRAIN (DRAIN) ×3 IMPLANT
GAUZE SPONGE 4X4 12PLY STRL (GAUZE/BANDAGES/DRESSINGS) ×3 IMPLANT
GLOVE BIO SURGEON STRL SZ7.5 (GLOVE) ×3 IMPLANT
GLOVE BIO SURGEON STRL SZ8 (GLOVE) ×3 IMPLANT
GLOVE BIOGEL PI IND STRL 8 (GLOVE) ×2 IMPLANT
GLOVE BIOGEL PI INDICATOR 8 (GLOVE) ×4
GOWN STRL REUS W/TWL LRG LVL3 (GOWN DISPOSABLE) ×3 IMPLANT
GOWN STRL REUS W/TWL XL LVL3 (GOWN DISPOSABLE) ×3 IMPLANT
KIT BASIN OR (CUSTOM PROCEDURE TRAY) ×3 IMPLANT
MANIFOLD NEPTUNE II (INSTRUMENTS) ×3 IMPLANT
PACK TOTAL JOINT (CUSTOM PROCEDURE TRAY) ×3 IMPLANT
PADDING CAST COTTON 6X4 STRL (CAST SUPPLIES) ×3 IMPLANT
POSITIONER SURGICAL ARM (MISCELLANEOUS) ×3 IMPLANT
STRIP CLOSURE SKIN 1/2X4 (GAUZE/BANDAGES/DRESSINGS) ×2 IMPLANT
SUT MNCRL AB 4-0 PS2 18 (SUTURE) ×3 IMPLANT
SUT VIC AB 2-0 CT1 27 (SUTURE) ×4
SUT VIC AB 2-0 CT1 TAPERPNT 27 (SUTURE) ×2 IMPLANT
SUT VLOC 180 0 24IN GS25 (SUTURE) ×3 IMPLANT
TOWEL OR 17X26 10 PK STRL BLUE (TOWEL DISPOSABLE) ×6 IMPLANT

## 2014-11-19 NOTE — Interval H&P Note (Signed)
History and Physical Interval Note:  11/19/2014 3:31 PM  Joshua Hughes  has presented today for surgery, with the diagnosis of ARTHROFIBROSIS OF RIGHT KNEE  The various methods of treatment have been discussed with the patient and family. After consideration of risks, benefits and other options for treatment, the patient has consented to  Procedure(s): [place] (Right) as a surgical intervention .  The patient's history has been reviewed, patient examined, no change in status, stable for surgery.  I have reviewed the patient's chart and labs.  Questions were answered to the patient's satisfaction.     Gearlean Alf

## 2014-11-19 NOTE — Brief Op Note (Signed)
11/19/2014  5:21 PM  PATIENT:  Joshua Hughes  69 y.o. male  PRE-OPERATIVE DIAGNOSIS:  ARTHROFIBROSIS OF RIGHT KNEE; OA left knee  POST-OPERATIVE DIAGNOSIS:  ARTHROFIBROSIS OF RIGHT KNEE; OA left knee  PROCEDURE:  1. Right knee arthrotomy with scar excision  2. Left knee corticosteroid injection  SURGEON:  Surgeon(s) and Role:    * Gearlean Alf, MD - Primary  PHYSICIAN ASSISTANT:   ASSISTANTS: Arlee Muslim, PA-C   ANESTHESIA:   general  EBL:     BLOOD ADMINISTERED:none  DRAINS: (medium) Hemovact drain(s) in the left knee with  Suction Open   LOCAL MEDICATIONS USED:  NONE  COUNTS:  YES  TOURNIQUET:   Total Tourniquet Time Documented: Calf (Right) - 23 minutes Total: Calf (Right) - 23 minutes   DICTATION: .Other Dictation: Dictation Number [id number]  PLAN OF CARE: Admit for overnight observation  PATIENT DISPOSITION:  PACU - hemodynamically stable.

## 2014-11-19 NOTE — Anesthesia Preprocedure Evaluation (Addendum)
Anesthesia Evaluation  Patient identified by MRN, date of birth, ID band Patient awake    Reviewed: Allergy & Precautions, NPO status , Patient's Chart, lab work & pertinent test results  Airway Mallampati: II  TM Distance: >3 FB Neck ROM: Full    Dental no notable dental hx.    Pulmonary former smoker,  breath sounds clear to auscultation  Pulmonary exam normal       Cardiovascular negative cardio ROS  Rhythm:Regular Rate:Normal     Neuro/Psych  Headaches, negative psych ROS   GI/Hepatic negative GI ROS, Neg liver ROS,   Endo/Other  negative endocrine ROS  Renal/GU negative Renal ROS  negative genitourinary   Musculoskeletal negative musculoskeletal ROS (+)   Abdominal (+) + obese,   Peds negative pediatric ROS (+)  Hematology negative hematology ROS (+)   Anesthesia Other Findings   Reproductive/Obstetrics negative OB ROS                            Anesthesia Physical Anesthesia Plan  ASA: II  Anesthesia Plan: General   Post-op Pain Management:    Induction: Intravenous  Airway Management Planned: LMA  Additional Equipment:   Intra-op Plan:   Post-operative Plan: Extubation in OR  Informed Consent: I have reviewed the patients History and Physical, chart, labs and discussed the procedure including the risks, benefits and alternatives for the proposed anesthesia with the patient or authorized representative who has indicated his/her understanding and acceptance.   Dental advisory given  Plan Discussed with: CRNA  Anesthesia Plan Comments: (Discussed spinal and general. He prefers general.)       Anesthesia Quick Evaluation

## 2014-11-19 NOTE — Anesthesia Postprocedure Evaluation (Signed)
  Anesthesia Post-op Note  Patient: Joshua Hughes  Procedure(s) Performed: Procedure(s) (LRB): RIGHT KNEE ARTHROTOMY,SCAR EXCISION, left knee steroid injection (Right)  Patient Location: PACU  Anesthesia Type: General  Level of Consciousness: awake and alert   Airway and Oxygen Therapy: Patient Spontanous Breathing  Post-op Pain: mild  Post-op Assessment: Post-op Vital signs reviewed, Patient's Cardiovascular Status Stable, Respiratory Function Stable, Patent Airway and No signs of Nausea or vomiting  Last Vitals:  Filed Vitals:   11/19/14 1835  BP: 160/82  Pulse: 77  Temp: 36.5 C  Resp: 12    Post-op Vital Signs: stable   Complications: No apparent anesthesia complications

## 2014-11-19 NOTE — Transfer of Care (Signed)
Immediate Anesthesia Transfer of Care Note  Patient: Joshua Hughes  Procedure(s) Performed: Procedure(s): RIGHT KNEE ARTHROTOMY,SCAR EXCISION, left knee steroid injection (Right)  Patient Location: PACU  Anesthesia Type:General  Level of Consciousness: awake and oriented  Airway & Oxygen Therapy: Patient Spontanous Breathing and Patient connected to face mask oxygen  Post-op Assessment: Report given to PACU RN and Post -op Vital signs reviewed and stable  Post vital signs: Reviewed and stable  Complications: No apparent anesthesia complications

## 2014-11-19 NOTE — Anesthesia Procedure Notes (Signed)
Procedure Name: LMA Insertion Date/Time: 11/19/2014 4:37 PM Performed by: Lajuana Carry E Pre-anesthesia Checklist: Patient identified, Timeout performed, Emergency Drugs available, Suction available and Patient being monitored Patient Re-evaluated:Patient Re-evaluated prior to inductionOxygen Delivery Method: Circle system utilized Preoxygenation: Pre-oxygenation with 100% oxygen Intubation Type: IV induction Ventilation: Mask ventilation without difficulty LMA: LMA inserted LMA Size: 4.0 Number of attempts: 1 Dental Injury: Teeth and Oropharynx as per pre-operative assessment

## 2014-11-20 ENCOUNTER — Encounter (HOSPITAL_COMMUNITY): Payer: Self-pay | Admitting: Orthopedic Surgery

## 2014-11-20 DIAGNOSIS — M24661 Ankylosis, right knee: Secondary | ICD-10-CM | POA: Diagnosis not present

## 2014-11-20 MED ORDER — OXYCODONE HCL 5 MG PO TABS: 5.0000 mg | ORAL_TABLET | ORAL | Status: AC | PRN

## 2014-11-20 MED ORDER — ASPIRIN EC 81 MG PO TBEC: 81.0000 mg | DELAYED_RELEASE_TABLET | Freq: Two times a day (BID) | ORAL | Status: AC

## 2014-11-20 MED ORDER — METHOCARBAMOL 500 MG PO TABS: 500.0000 mg | ORAL_TABLET | Freq: Four times a day (QID) | ORAL | Status: AC | PRN

## 2014-11-20 NOTE — Discharge Instructions (Signed)
Pick up stool softner and laxative for home use following surgery while on pain medications. Do not submerge incision under water. Please use good hand washing techniques while changing dressing each day. May shower starting three days after surgery. Please use a clean towel to pat the incision dry following showers. Continue to use ice for pain and swelling after surgery. Do not use any lotions or creams on the incision until instructed by your surgeon. May start changing dressing tomorrow, Friday 11/21/2014, and then change daily.  The patient may resume the 81 mg Aspirin but take twice a day for 4 weeks and then reduce back down to once a day.  Postoperative Constipation Protocol  Constipation - defined medically as fewer than three stools per week and severe constipation as less than one stool per week.  One of the most common issues patients have following surgery is constipation.  Even if you have a regular bowel pattern at home, your normal regimen is likely to be disrupted due to multiple reasons following surgery.  Combination of anesthesia, postoperative narcotics, change in appetite and fluid intake all can affect your bowels.  In order to avoid complications following surgery, here are some recommendations in order to help you during your recovery period.  Colace (docusate) - Pick up an over-the-counter form of Colace or another stool softener and take twice a day as long as you are requiring postoperative pain medications.  Take with a full glass of water daily.  If you experience loose stools or diarrhea, hold the colace until you stool forms back up.  If your symptoms do not get better within 1 week or if they get worse, check with your doctor.  Dulcolax (bisacodyl) - Pick up over-the-counter and take as directed by the product packaging as needed to assist with the movement of your bowels.  Take with a full glass of water.  Use this product as needed if not relieved by Colace only.     MiraLax (polyethylene glycol) - Pick up over-the-counter to have on hand.  MiraLax is a solution that will increase the amount of water in your bowels to assist with bowel movements.  Take as directed and can mix with a glass of water, juice, soda, coffee, or tea.  Take if you go more than two days without a movement. Do not use MiraLax more than once per day. Call your doctor if you are still constipated or irregular after using this medication for 7 days in a row.  If you continue to have problems with postoperative constipation, please contact the office for further assistance and recommendations.  If you experience "the worst abdominal pain ever" or develop nausea or vomiting, please contact the office immediatly for further recommendations for treatment.

## 2014-11-20 NOTE — Care Management Note (Signed)
    Page 1 of 1   11/20/2014     12:07:40 PM CARE MANAGEMENT NOTE 11/20/2014  Patient:  Joshua Hughes,Joshua Hughes   Account Number:  [id number]  Date Initiated:  11/20/2014  Documentation initiated by:  JEFFRIES,SARAH  Subjective/Objective Assessment:   adm: ARTHROFIBROSIS OF RIGHT KNEE; OA left knee     Action/Plan:   discharge planning   Anticipated DC Date:  11/20/2014   Anticipated DC Plan:        DC Planning Services  CM consult      Choice offered to / List presented to:     DME arranged  CPM      DME agency  [facility]        Status of service:  Completed, signed off Medicare Important Message given?   (If response is "NO", the following Medicare IM given date fields will be blank) Date Medicare IM given:   Medicare IM given by:   Date Additional Medicare IM given:   Additional Medicare IM given by:    Discharge Disposition:  HOME/SELF CARE  Per UR Regulation:    If discussed at Long Length of Stay Meetings, dates discussed:    Comments:  11/20/14 08:00 CM met with pt in room to discuss CPM which will be provided by [facility].  CM called [facility] and per their request, faxed facesheet, F2F, orders, , OP note, H&P, and DC summary.  Pt has script to arrange his choice of PT.  No other CM needs were communicated.  sarah Jeffries, BSN, CM [phone].   

## 2014-11-20 NOTE — Progress Notes (Signed)
   Subjective: 1 Day Post-Op Procedure(s) (LRB): RIGHT KNEE ARTHROTOMY,SCAR EXCISION, left knee steroid injection (Right) Patient reports pain as mild.   Patient seen in rounds with Dr. Wynelle Link. Patient is well, and has had no acute complaints or problems Patient is ready to go home today.   He will NOT need HHPT.  He will go straight to outpatient therapy. He WILL need a home CPM though setup before he leaves today.  Objective: Vital signs in last 24 hours: Temp:  [97.2 F (36.2 C)-98.1 F (36.7 C)] 98.1 F (36.7 C) (01/28 0442) Pulse Rate:  [74-81] 77 (01/28 0442) Resp:  [12-20] 18 (01/28 0442) BP: (115-175)/(80-102) 115/80 mmHg (01/28 0442) SpO2:  [92 %-100 %] 97 % (01/28 0442) Weight:  [119.75 kg (264 lb)] 119.75 kg (264 lb) (01/27 1830)  Intake/Output from previous day:  Intake/Output Summary (Last 24 hours) at 11/20/14 0745 Last data filed at 11/20/14 1791  Gross per 24 hour  Intake 2679.99 ml  Output   1330 ml  Net 1349.99 ml    Labs: No results for input(s): HGB in the last 72 hours. No results for input(s): WBC, RBC, HCT, PLT in the last 72 hours. No results for input(s): NA, K, CL, CO2, BUN, CREATININE, GLUCOSE, CALCIUM in the last 72 hours. No results for input(s): LABPT, INR in the last 72 hours.  EXAM: General - Patient is Alert, Appropriate and Oriented Extremity - Neurovascular intact Sensation intact distally Dorsiflexion/Plantar flexion intact Dressing - clean, dry, no drainage Motor Function - intact, moving foot and toes well on exam.   Assessment/Plan: 1 Day Post-Op Procedure(s) (LRB): RIGHT KNEE ARTHROTOMY,SCAR EXCISION, left knee steroid injection (Right) Procedure(s) (LRB): RIGHT KNEE ARTHROTOMY,SCAR EXCISION, left knee steroid injection (Right) Past Medical History  Diagnosis Date  . Headache     occasional  . H/O melanoma excision    Principal Problem:   Postoperative stiffness of total knee replacement  Estimated body mass index is  36.84 kg/(m^2) as calculated from the following:   Height as of this encounter: 5\' 11"  (1.803 m).   Weight as of this encounter: 119.75 kg (264 lb). Up with therapy Discharge home with home CPM (but no HHPT) Diet - Regular diet Follow up - in 2 weeks Activity - WBAT Disposition - Home Condition Upon Discharge - Good D/C Meds - See DC Summary DVT Prophylaxis - Aspirin twice a day.  Arlee Muslim, PA-C Orthopaedic Surgery 11/20/2014, 7:45 AM

## 2014-11-20 NOTE — Progress Notes (Signed)
UR completed 

## 2014-11-20 NOTE — Evaluation (Signed)
Physical Therapy Evaluation Patient Details Name: Joshua Hughes MRN: [id number] DOB: 1946-06-04 Today's Date: 11/20/2014   History of Present Illness  69 yo male s/p R knee arthrotomy with scar excision, L knee corticosteroid injection 11/19/13. Hx of R TKA and manipulation 2012.   Clinical Impression  On eval, pt was Min guard to supervision assist for mobility-able to ambulate ~175 feet with RW and climb 2 steps with use of rail. Issued HEP for pt to perform at home until he begins OPPT. Encouraged pt to use RW for ambulation. All education completed.     Follow Up Recommendations Outpatient PT    Equipment Recommendations  None recommended by PT    Recommendations for Other Services       Precautions / Restrictions Precautions Precautions: Knee Restrictions Weight Bearing Restrictions: No      Mobility  Bed Mobility Overal bed mobility: Needs Assistance Bed Mobility: Supine to Sit;Sit to Supine     Supine to sit: Supervision Sit to supine: Supervision      Transfers Overall transfer level: Needs assistance Equipment used: Rolling walker (2 wheeled) Transfers: Sit to/from Stand Sit to Stand: Supervision         General transfer comment: VCs safety, hand placement  Ambulation/Gait Ambulation/Gait assistance: Supervision Ambulation Distance (Feet): 175 Feet Assistive device: Rolling walker (2 wheeled) Gait Pattern/deviations: Step-to pattern;Step-through pattern;Trunk flexed;Antalgic     General Gait Details: VCs safety  Stairs Stairs: Yes Stairs assistance: Min guard Stair Management: Step to pattern;Forwards;One rail Right;One rail Left Number of Stairs: 2 General stair comments: VCs safety, technique, sequence. close guard for safety  Wheelchair Mobility    Modified Rankin (Stroke Patients Only)       Balance                                             Pertinent Vitals/Pain Pain Assessment: 0-10 Pain Score: 4  Pain  Location:  r knee Pain Descriptors / Indicators: Sore;Aching Pain Intervention(s): Monitored during session;Ice applied    Home Living Family/patient expects to be discharged to:: Private residence Living Arrangements: Spouse/significant other   Type of Home: House Home Access: Stairs to enter Entrance Stairs-Rails: Psychiatric nurse of Steps: 4 Home Layout: Multi-level Home Equipment: Environmental consultant - 2 wheels;Crutches;Cane - single point      Prior Function Level of Independence: Independent               Hand Dominance        Extremity/Trunk Assessment   Upper Extremity Assessment: Overall WFL for tasks assessed           Lower Extremity Assessment: RLE deficits/detail RLE Deficits / Details: hip flex 3/5, moves ankle well    Cervical / Trunk Assessment: Normal  Communication   Communication: No difficulties  Cognition Arousal/Alertness: Awake/alert Behavior During Therapy: WFL for tasks assessed/performed Overall Cognitive Status: Within Functional Limits for tasks assessed                      General Comments      Exercises Total Joint Exercises Ankle Circles/Pumps: AROM;Both;Supine;10 reps Quad Sets: AROM;Both;10 reps;Supine Heel Slides: AAROM;Right;10 reps;Supine Hip ABduction/ADduction: AROM;Right;10 reps;Supine Straight Leg Raises: AROM;Right;10 reps;Supine Knee Flexion: AROM;Right;5 reps;Seated Goniometric ROM: ~10-75 degrees      Assessment/Plan    PT Assessment All further PT needs can be met in the next  venue of care  PT Diagnosis Difficulty walking;Acute pain   PT Problem List Decreased strength;Decreased range of motion;Decreased activity tolerance;Decreased balance;Decreased mobility;Pain;Decreased knowledge of use of DME  PT Treatment Interventions     PT Goals (Current goals can be found in the Care Plan section) Acute Rehab PT Goals Patient Stated Goal: improve ROM. regain independence PT Goal Formulation:  All assessment and education complete, DC therapy    Frequency     Barriers to discharge        Co-evaluation               End of Session Equipment Utilized During Treatment: Gait belt Activity Tolerance: Patient tolerated treatment well Patient left: in bed;with call bell/phone within reach           Time: 0930-1011 PT Time Calculation (min) (ACUTE ONLY): 41 min   Charges:   PT Evaluation $Initial PT Evaluation Tier I: 1 Procedure PT Treatments $Gait Training: 8-22 mins $Therapeutic Exercise: 8-22 mins $Self Care/Home Management: 8-22   PT G Codes:        Weston Anna, MPT Pager: [phone]

## 2014-11-20 NOTE — Progress Notes (Signed)
   11/20/14 1157  PT Time Calculation  PT Start Time (ACUTE ONLY) 0930  PT Stop Time (ACUTE ONLY) 1011  PT Time Calculation (min) (ACUTE ONLY) 41 min  PT G-Codes **NOT FOR INPATIENT CLASS**  Functional Assessment Tool Used (clinical judgement)  Functional Limitation Mobility: Walking and moving around  Mobility: Walking and Moving Around Current Status (D7412) CI  Mobility: Walking and Moving Around Goal Status (I7867) CI  Mobility: Walking and Moving Around Discharge Status (E7209) CI  PT General Charges  $$ ACUTE PT VISIT 1 Procedure  PT Evaluation  $Initial PT Evaluation Tier I 1 Procedure  PT Treatments  $Gait Training 8-22 mins  $Therapeutic Exercise 8-22 mins  $Self Care/Home Management 8-22   Weston Anna, MPT [phone]

## 2014-11-20 NOTE — Discharge Summary (Signed)
Physician Discharge Summary   Patient ID: Joshua Hughes MRN: [id number] DOB/AGE: 67-Dec-1947 67 y.o.  Admit date: 11/19/2014 Discharge date: 11-20-2014  Primary Diagnosis:  ARTHROFIBROSIS OF RIGHT KNEE; OA left knee Admission Diagnoses:  Past Medical History  Diagnosis Date  . Headache     occasional  . H/O melanoma excision    Discharge Diagnoses:   Principal Problem:   Postoperative stiffness of total knee replacement  Estimated body mass index is 36.84 kg/(m^2) as calculated from the following:   Height as of this encounter: 5\' 11"  (1.803 m).   Weight as of this encounter: 119.75 kg (264 lb).  Procedure(s) (LRB): RIGHT KNEE ARTHROTOMY,SCAR EXCISION, left knee steroid injection (Right)   Consults: None  HPI: Patient presents for follow up on a knee problem involving the right knee. Onset of the symptoms was several years ago. Inciting event: He had a right total knee arthroplasty approximately 4 years ago and had post-operative stiffness requiring manipulationn. He was unable to do adequate PT post-operatively and developed stiffness again. He presented to the office 2 months ago with a severely stiff knee. His prosthesis had no sgns of loosening or failure. We discussed tretment options and he presents now for arthrotomy and scar excision with possible component revision if vecessary. Current symptoms include stiffness. Laboratory Data: Hospital Outpatient Visit on 11/13/2014  Component Date Value Ref Range Status  . WBC 11/13/2014 7.7  4.0 - 10.5 K/uL Final  . RBC 11/13/2014 5.28  4.22 - 5.81 MIL/uL Final  . Hemoglobin 11/13/2014 16.2  13.0 - 17.0 g/dL Final  . HCT 11/13/2014 47.4  39.0 - 52.0 % Final  . MCV 11/13/2014 89.8  78.0 - 100.0 fL Final  . MCH 11/13/2014 30.7  26.0 - 34.0 pg Final  . MCHC 11/13/2014 34.2  30.0 - 36.0 g/dL Final  . RDW 11/13/2014 12.9  11.5 - 15.5 % Final  . Platelets 11/13/2014 249  150 - 400 K/uL Final  . MRSA, PCR 11/13/2014 NEGATIVE   NEGATIVE Final  . Staphylococcus aureus 11/13/2014 NEGATIVE  NEGATIVE Final   Comment:        The Xpert SA Assay (FDA approved for NASAL specimens in patients over 90 years of age), is one component of a comprehensive surveillance program.  Test performance has been validated by [facility] for patients greater than or equal to 43 year old. It is not intended to diagnose infection nor to guide or monitor treatment.      X-Rays:No results found.  EKG:No orders found for this or any previous visit.   Hospital Course: Patient was admitted to [facility] and taken to the OR and underwent the above state procedure without complications.  Patient tolerated the procedure well and was later transferred to the recovery room and then to the orthopaedic floor for postoperative care.  They were given PO and IV analgesics for pain control following their surgery.  They were given 24 hours of postoperative antibiotics of  Anti-infectives    Start     Dose/Rate Route Frequency Ordered Stop   11/19/14 2200  ceFAZolin (ANCEF) IVPB 2 g/50 mL premix     2 g100 mL/hr over 30 Minutes Intravenous Every 6 hours 11/19/14 1844 11/20/14 1559   11/19/14 0600  ceFAZolin (ANCEF) 3 g in dextrose 5 % 50 mL IVPB     3 g160 mL/hr over 30 Minutes Intravenous On call to O.R. 11/18/14 1430 11/19/14 1630     and started on DVT prophylaxis in  the form of Lovenox. Patient was encouraged to get up and ambulate the day after surgery.  The patient was allowed to be WBAT with therapy. Discharge planning was consulted to help with postop disposition and equipment needs.  Patient had a good night on the evening of surgery.  They started to get up OOB with therapy on day one.   Patient was seen in rounds and was ready to go home following morning ambulation.  A home CPM was arranged for home use by the patient.  He will go straight to outpatient therapy and will not need HHPT  Diet - Regular diet Follow up - in two  weeks. Call office for appointment at [phone]. Activity - Weight bearing as tolerated to the surgical leg.  May start showering three days following surgery but do not submerge incision under water. Continue to use ice for pain and swelling from surgery.  Baby Aspirin 81 mg twice a day for 4 weeks and then reduce back down to once a day. May start changing dressing tomorrow, Friday 11/21/2014, with dry gauze and tape.  Disposition - Home Condition Upon Discharge - Good D/C Meds - See DC Summary DVT Prophylaxis - Aspirin twice a day for 4 weeks and then once a day.   Discharge Instructions    CPM    Complete by:  As directed   Continuous passive motion machine (CPM):      Use the CPM from Zero degrees to 70 degrees for 6-8 hours per day.      You may increase by 5 degrees per day.  You may break it up into 2 or 3 sessions per day.      Use CPM for 3 weeks or until you are told to stop.     Call MD / Call 911    Complete by:  As directed   If you experience chest pain or shortness of breath, CALL 911 and be transported to the hospital emergency room.  If you develope a fever above 101 F, pus (white drainage) or increased drainage or redness at the wound, or calf pain, call your surgeon's office.     Change dressing    Complete by:  As directed   Change dressing daily with sterile 4 x 4 inch gauze dressing and apply TED hose. Do not submerge the incision under water.     Constipation Prevention    Complete by:  As directed   Drink plenty of fluids.  Prune juice may be helpful.  You may use a stool softener, such as Colace (over the counter) 100 mg twice a day.  Use MiraLax (over the counter) for constipation as needed.     Diet general    Complete by:  As directed      Discharge instructions    Complete by:  As directed   Pick up stool softner and laxative for home use following surgery while on pain medications. Do not submerge incision under water. Please use good hand washing  techniques while changing dressing each day. May shower starting three days after surgery. Please use a clean towel to pat the incision dry following showers. Continue to use ice for pain and swelling after surgery. Do not use any lotions or creams on the incision until instructed by your surgeon. May start changing dressing tomorrow, Friday 11/21/2014, and then change daily.  The patient may resume the 81 mg Aspirin but take twice a day for 4 weeks and then reduce back down  to once a day.  Postoperative Constipation Protocol  Constipation - defined medically as fewer than three stools per week and severe constipation as less than one stool per week.  One of the most common issues patients have following surgery is constipation.  Even if you have a regular bowel pattern at home, your normal regimen is likely to be disrupted due to multiple reasons following surgery.  Combination of anesthesia, postoperative narcotics, change in appetite and fluid intake all can affect your bowels.  In order to avoid complications following surgery, here are some recommendations in order to help you during your recovery period.  Colace (docusate) - Pick up an over-the-counter form of Colace or another stool softener and take twice a day as long as you are requiring postoperative pain medications.  Take with a full glass of water daily.  If you experience loose stools or diarrhea, hold the colace until you stool forms back up.  If your symptoms do not get better within 1 week or if they get worse, check with your doctor.  Dulcolax (bisacodyl) - Pick up over-the-counter and take as directed by the product packaging as needed to assist with the movement of your bowels.  Take with a full glass of water.  Use this product as needed if not relieved by Colace only.   MiraLax (polyethylene glycol) - Pick up over-the-counter to have on hand.  MiraLax is a solution that will increase the amount of water in your bowels to  assist with bowel movements.  Take as directed and can mix with a glass of water, juice, soda, coffee, or tea.  Take if you go more than two days without a movement. Do not use MiraLax more than once per day. Call your doctor if you are still constipated or irregular after using this medication for 7 days in a row.  If you continue to have problems with postoperative constipation, please contact the office for further assistance and recommendations.  If you experience "the worst abdominal pain ever" or develop nausea or vomiting, please contact the office immediatly for further recommendations for treatment.     Do not put a pillow under the knee. Place it under the heel.    Complete by:  As directed      Do not sit on low chairs, stoools or toilet seats, as it may be difficult to get up from low surfaces    Complete by:  As directed      Driving restrictions    Complete by:  As directed   No driving until released by the physician.     Increase activity slowly as tolerated    Complete by:  As directed      Lifting restrictions    Complete by:  As directed   No lifting until released by the physician.     Patient may shower    Complete by:  As directed   You may shower without a dressing once there is no drainage.  Do not wash over the wound.  If drainage remains, do not shower until drainage stops.     TED hose    Complete by:  As directed   Use stockings (TED hose) for 3 weeks on both leg(s).  You may remove them at night for sleeping.     Weight bearing as tolerated    Complete by:  As directed   Laterality:  right  Extremity:  Lower  Medication List    STOP taking these medications        Fish Oil 1000 MG Caps      TAKE these medications        aspirin EC 81 MG tablet  Take 1 tablet (81 mg total) by mouth 2 (two) times daily before a meal. Increase to twice a day for 4 weeks and then reduce back to once a day at home.     methocarbamol 500 MG tablet  Commonly  known as:  ROBAXIN  Take 1 tablet (500 mg total) by mouth every 6 (six) hours as needed for muscle spasms.     oxyCODONE 5 MG immediate release tablet  Commonly known as:  Oxy IR/ROXICODONE  Take 1-2 tablets (5-10 mg total) by mouth every 3 (three) hours as needed for moderate pain, severe pain or breakthrough pain.           Follow-up Information    Follow up with Gearlean Alf, MD. Schedule an appointment as soon as possible for a visit in 2 weeks.   Specialty:  Orthopedic Surgery   Why:  Call office at [phone] to set up appointment with Dr. Wynelle Link.   Contact information:   [address] [place] [address] [phone]       Signed: Arlee Muslim, PA-C Orthopaedic Surgery 11/20/2014, 7:57 AM

## 2014-11-20 NOTE — Op Note (Signed)
NAME:  [name], Hughes               ACCOUNT NO.:  [id number]  MEDICAL RECORD NO.:  [id number]  LOCATION:  9242                         FACILITY:  [facility]  PHYSICIAN:  Joshua Hughes, M.D.    DATE OF BIRTH:  October 06, 1946  DATE OF PROCEDURE:  11/19/2014 DATE OF DISCHARGE:                              OPERATIVE REPORT   PREOPERATIVE DIAGNOSES: 1. Arthrofibrosis, right knee. 2. Osteoarthritis, left knee.  POSTOPERATIVE DIAGNOSES: 1. Arthrofibrosis, right knee. 2. Osteoarthritis, left knee.  PROCEDURE: 1. Right knee arthrotomy and scar excision. 2. Left knee corticosteroid injection.  SURGEON:  Joshua Arabian, MD  ASSISTANT:  Joshua L. Perkins, PA-C  ANESTHESIA:  General.  ESTIMATED BLOOD LOSS:  Minimal.  DRAINS:  Hemovac x1.  TOURNIQUET TIME:  Twenty three minutes at 300 mmHg.  COMPLICATIONS:  None.  CONDITION:  Stable to recovery.  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  He has had problems with significant stiffness in the knee.  He presented to our office approximately 2 months ago with range of motion 5 to 75 degrees.  He was having discomfort due to the stiffness.  The radiographs did not show evidence of loosening.  He presents now for arthrotomy with scar excision, possible component revision if necessary.  PROCEDURE IN DETAIL:  After successful administration of general anesthetic, tourniquet was placed high on the right thigh.  Right lower extremity prepped and draped in usual sterile fashion.  Extremities wrapped in the Esmarch.  Tourniquet was inflated to 300 mmHg.  Exam shows that range of motion is 5 to 75 degrees.  A 10 blade was used to make a midline incision through the skin to the subcutaneous tissue.  He had adhesions in the subcu tissue.  I created a circumferential subcu flap all the way up into the mid thigh and medially and laterally past the  collateral ligaments.  I then used a fresh blade to make a medial parapatellar arthrotomy.  There was a tremendous amount of dense scar in the joint.  The scar was removed with a fresh 10 blade.  Soft tissue of the proximal medial tibia was then subperiosteally elevated to the joint line with a knife and into the semimembranosus bursa with a Cobb elevator.  There was some scar present adjacent to the [facility] which was also removed.  Once we cleaned out the medial side and recreated the medial gutter and medial suprapatellar pouch, I then addressed the lateral side.  Scar was removed from the lateral side in the suprapatellar region and the lateral gutter and then infrapatellar region.  Patella mobility was improved dramatically.  I then flexed the knee, and was able to flex to about 130 degrees with the patella tracking normally.  All the components were in good position and were well fixed.  The polyethylene had no wear, so was left intact.  Wound was then copiously irrigated with saline solution and the arthrotomy closed over Hemovac drain with running #1 V-Loc suture.  Flexion against gravity was 120 degrees with the joint closed.  Subcu was closed over a second limb of the Hemovac drain  with interrupted 2-0 Vicryl and then tourniquet released.  Total time of 23 minutes.  Subcuticular was closed with running 4-0 Monocryl.  Incisions cleaned and dried and Steri-Strips and a bulky sterile dressing applied.  He was placed into a knee immobilizer.  I then prepped the left knee with Betadine and injected with 5 mL of 1% lidocaine and 80 mg of Depo-Medrol with no problems.  It was then cleaned and a Band-Aid was placed.  He was subsequently awakened and transported to recovery in stable condition.  Note that a surgical assistant was a medical necessity for the right knee arthrotomy and scar excision.  Assistant was necessary for retraction of vital ligaments and neurovascular structures to  prevent damage to those structures while excising the dense adherent scar.     Joshua Hughes, M.D.     FA/MEDQ  D:  11/19/2014  T:  11/20/2014  Job:  [id number]

## 2016-11-11 DIAGNOSIS — Z9181 History of falling: Secondary | ICD-10-CM | POA: Diagnosis not present

## 2016-11-11 DIAGNOSIS — J329 Chronic sinusitis, unspecified: Secondary | ICD-10-CM | POA: Diagnosis not present

## 2016-11-11 DIAGNOSIS — J40 Bronchitis, not specified as acute or chronic: Secondary | ICD-10-CM | POA: Diagnosis not present

## 2017-01-24 DIAGNOSIS — E1169 Type 2 diabetes mellitus with other specified complication: Secondary | ICD-10-CM | POA: Diagnosis not present

## 2017-01-24 DIAGNOSIS — I10 Essential (primary) hypertension: Secondary | ICD-10-CM | POA: Diagnosis not present

## 2017-01-24 DIAGNOSIS — E785 Hyperlipidemia, unspecified: Secondary | ICD-10-CM | POA: Diagnosis not present

## 2017-01-24 DIAGNOSIS — E119 Type 2 diabetes mellitus without complications: Secondary | ICD-10-CM | POA: Diagnosis not present

## 2017-01-24 DIAGNOSIS — E7800 Pure hypercholesterolemia, unspecified: Secondary | ICD-10-CM | POA: Diagnosis not present

## 2017-07-18 DIAGNOSIS — E119 Type 2 diabetes mellitus without complications: Secondary | ICD-10-CM | POA: Diagnosis not present

## 2017-07-18 DIAGNOSIS — H52223 Regular astigmatism, bilateral: Secondary | ICD-10-CM | POA: Diagnosis not present

## 2017-07-25 DIAGNOSIS — I10 Essential (primary) hypertension: Secondary | ICD-10-CM | POA: Diagnosis not present

## 2017-07-25 DIAGNOSIS — Z79899 Other long term (current) drug therapy: Secondary | ICD-10-CM | POA: Diagnosis not present

## 2017-07-25 DIAGNOSIS — Z0000 Encounter for general adult medical examination without abnormal findings: Secondary | ICD-10-CM | POA: Diagnosis not present

## 2017-07-25 DIAGNOSIS — Z23 Encounter for immunization: Secondary | ICD-10-CM | POA: Diagnosis not present

## 2017-07-25 DIAGNOSIS — M25561 Pain in right knee: Secondary | ICD-10-CM | POA: Diagnosis not present

## 2017-07-25 DIAGNOSIS — E1169 Type 2 diabetes mellitus with other specified complication: Secondary | ICD-10-CM | POA: Diagnosis not present

## 2017-07-25 DIAGNOSIS — E785 Hyperlipidemia, unspecified: Secondary | ICD-10-CM | POA: Diagnosis not present

## 2017-11-28 DIAGNOSIS — M25561 Pain in right knee: Secondary | ICD-10-CM | POA: Diagnosis not present

## 2017-11-28 DIAGNOSIS — G8929 Other chronic pain: Secondary | ICD-10-CM | POA: Diagnosis not present

## 2017-11-28 DIAGNOSIS — Z96651 Presence of right artificial knee joint: Secondary | ICD-10-CM | POA: Diagnosis not present

## 2017-11-28 DIAGNOSIS — L905 Scar conditions and fibrosis of skin: Secondary | ICD-10-CM | POA: Diagnosis not present

## 2018-01-30 DIAGNOSIS — Z79899 Other long term (current) drug therapy: Secondary | ICD-10-CM | POA: Diagnosis not present

## 2018-01-30 DIAGNOSIS — E785 Hyperlipidemia, unspecified: Secondary | ICD-10-CM | POA: Diagnosis not present

## 2018-01-30 DIAGNOSIS — I10 Essential (primary) hypertension: Secondary | ICD-10-CM | POA: Diagnosis not present

## 2018-01-30 DIAGNOSIS — M25561 Pain in right knee: Secondary | ICD-10-CM | POA: Diagnosis not present

## 2018-01-30 DIAGNOSIS — E1169 Type 2 diabetes mellitus with other specified complication: Secondary | ICD-10-CM | POA: Diagnosis not present

## 2018-04-10 DIAGNOSIS — D2239 Melanocytic nevi of other parts of face: Secondary | ICD-10-CM | POA: Diagnosis not present

## 2018-04-10 DIAGNOSIS — C44229 Squamous cell carcinoma of skin of left ear and external auricular canal: Secondary | ICD-10-CM | POA: Diagnosis not present

## 2018-05-02 DIAGNOSIS — C44229 Squamous cell carcinoma of skin of left ear and external auricular canal: Secondary | ICD-10-CM | POA: Diagnosis not present

## 2018-05-17 DIAGNOSIS — R42 Dizziness and giddiness: Secondary | ICD-10-CM | POA: Diagnosis not present

## 2018-05-17 DIAGNOSIS — Z79899 Other long term (current) drug therapy: Secondary | ICD-10-CM | POA: Diagnosis not present

## 2018-05-17 DIAGNOSIS — I10 Essential (primary) hypertension: Secondary | ICD-10-CM | POA: Diagnosis not present

## 2018-05-17 DIAGNOSIS — R1111 Vomiting without nausea: Secondary | ICD-10-CM | POA: Diagnosis not present

## 2018-05-17 DIAGNOSIS — E119 Type 2 diabetes mellitus without complications: Secondary | ICD-10-CM | POA: Diagnosis not present

## 2018-05-17 DIAGNOSIS — R112 Nausea with vomiting, unspecified: Secondary | ICD-10-CM | POA: Diagnosis not present

## 2018-05-17 DIAGNOSIS — R110 Nausea: Secondary | ICD-10-CM | POA: Diagnosis not present

## 2018-05-17 DIAGNOSIS — J9811 Atelectasis: Secondary | ICD-10-CM | POA: Diagnosis not present

## 2018-05-28 DIAGNOSIS — E785 Hyperlipidemia, unspecified: Secondary | ICD-10-CM | POA: Diagnosis not present

## 2018-05-28 DIAGNOSIS — R42 Dizziness and giddiness: Secondary | ICD-10-CM | POA: Diagnosis not present

## 2018-05-28 DIAGNOSIS — E1169 Type 2 diabetes mellitus with other specified complication: Secondary | ICD-10-CM | POA: Diagnosis not present

## 2018-05-28 DIAGNOSIS — E7800 Pure hypercholesterolemia, unspecified: Secondary | ICD-10-CM | POA: Diagnosis not present

## 2018-05-28 DIAGNOSIS — I10 Essential (primary) hypertension: Secondary | ICD-10-CM | POA: Diagnosis not present

## 2018-07-26 DIAGNOSIS — E1169 Type 2 diabetes mellitus with other specified complication: Secondary | ICD-10-CM | POA: Diagnosis not present

## 2018-07-26 DIAGNOSIS — Z0000 Encounter for general adult medical examination without abnormal findings: Secondary | ICD-10-CM | POA: Diagnosis not present

## 2018-07-26 DIAGNOSIS — M791 Myalgia, unspecified site: Secondary | ICD-10-CM | POA: Diagnosis not present

## 2018-07-26 DIAGNOSIS — G8929 Other chronic pain: Secondary | ICD-10-CM | POA: Diagnosis not present

## 2018-07-26 DIAGNOSIS — Z23 Encounter for immunization: Secondary | ICD-10-CM | POA: Diagnosis not present

## 2018-07-26 DIAGNOSIS — E785 Hyperlipidemia, unspecified: Secondary | ICD-10-CM | POA: Diagnosis not present

## 2018-07-26 DIAGNOSIS — T466X5A Adverse effect of antihyperlipidemic and antiarteriosclerotic drugs, initial encounter: Secondary | ICD-10-CM | POA: Diagnosis not present

## 2018-07-26 DIAGNOSIS — Z79899 Other long term (current) drug therapy: Secondary | ICD-10-CM | POA: Diagnosis not present

## 2019-01-23 DIAGNOSIS — Z79899 Other long term (current) drug therapy: Secondary | ICD-10-CM | POA: Diagnosis not present

## 2019-01-23 DIAGNOSIS — E785 Hyperlipidemia, unspecified: Secondary | ICD-10-CM | POA: Diagnosis not present

## 2019-01-23 DIAGNOSIS — T466X5A Adverse effect of antihyperlipidemic and antiarteriosclerotic drugs, initial encounter: Secondary | ICD-10-CM | POA: Diagnosis not present

## 2019-01-23 DIAGNOSIS — M791 Myalgia, unspecified site: Secondary | ICD-10-CM | POA: Diagnosis not present

## 2019-01-23 DIAGNOSIS — E1169 Type 2 diabetes mellitus with other specified complication: Secondary | ICD-10-CM | POA: Diagnosis not present

## 2019-04-11 ENCOUNTER — Other Ambulatory Visit: Payer: Self-pay

## 2019-04-11 NOTE — Patient Outreach (Signed)
[facility]) Care Management  04/11/2019  Joshua Hughes Mar 14, 1946 [id number]   Medication Adherence call to Joshua Hughes Compliant Voice message left with a call back number. Joshua Hughes is showing past due on Metformin Er 500 mg and Lisinopril 20 mg under [place].   [place] Management Direct Dial [phone]  Fax [phone] [email or web address]

## 2019-06-03 DIAGNOSIS — C44212 Basal cell carcinoma of skin of right ear and external auricular canal: Secondary | ICD-10-CM | POA: Diagnosis not present

## 2019-06-20 DIAGNOSIS — C44222 Squamous cell carcinoma of skin of right ear and external auricular canal: Secondary | ICD-10-CM | POA: Diagnosis not present

## 2019-07-10 DIAGNOSIS — E785 Hyperlipidemia, unspecified: Secondary | ICD-10-CM | POA: Diagnosis not present

## 2019-07-10 DIAGNOSIS — E1169 Type 2 diabetes mellitus with other specified complication: Secondary | ICD-10-CM | POA: Diagnosis not present

## 2019-07-10 DIAGNOSIS — Z79899 Other long term (current) drug therapy: Secondary | ICD-10-CM | POA: Diagnosis not present

## 2019-07-15 DIAGNOSIS — E1169 Type 2 diabetes mellitus with other specified complication: Secondary | ICD-10-CM | POA: Diagnosis not present

## 2019-07-15 DIAGNOSIS — E785 Hyperlipidemia, unspecified: Secondary | ICD-10-CM | POA: Diagnosis not present

## 2019-07-15 DIAGNOSIS — Z23 Encounter for immunization: Secondary | ICD-10-CM | POA: Diagnosis not present

## 2019-07-15 DIAGNOSIS — Z0000 Encounter for general adult medical examination without abnormal findings: Secondary | ICD-10-CM | POA: Diagnosis not present

## 2019-07-15 DIAGNOSIS — I10 Essential (primary) hypertension: Secondary | ICD-10-CM | POA: Diagnosis not present

## 2019-07-15 DIAGNOSIS — E7800 Pure hypercholesterolemia, unspecified: Secondary | ICD-10-CM | POA: Diagnosis not present

## 2019-07-16 ENCOUNTER — Other Ambulatory Visit: Payer: Self-pay

## 2019-07-16 NOTE — Patient Outreach (Signed)
[place] [facility]) Care Management  07/16/2019  GERHARDT HUAMAN 25-Dec-1945 [id number]   Medication Adherence call to Mr. Garfield Compliant Voice message left with a call back number. Mr. Gramza is showing past due on Metformin Er 500 mg and Lisinopril 20 mg under [place].   [place] Management Direct Dial [phone]  Fax [phone] [email or web address]

## 2019-11-18 DIAGNOSIS — E119 Type 2 diabetes mellitus without complications: Secondary | ICD-10-CM | POA: Diagnosis not present

## 2019-11-18 DIAGNOSIS — H5203 Hypermetropia, bilateral: Secondary | ICD-10-CM | POA: Diagnosis not present

## 2020-01-21 DIAGNOSIS — D1801 Hemangioma of skin and subcutaneous tissue: Secondary | ICD-10-CM | POA: Diagnosis not present

## 2020-01-21 DIAGNOSIS — L570 Actinic keratosis: Secondary | ICD-10-CM | POA: Diagnosis not present

## 2020-01-21 DIAGNOSIS — L821 Other seborrheic keratosis: Secondary | ICD-10-CM | POA: Diagnosis not present

## 2020-01-28 DIAGNOSIS — E785 Hyperlipidemia, unspecified: Secondary | ICD-10-CM | POA: Diagnosis not present

## 2020-01-28 DIAGNOSIS — E7800 Pure hypercholesterolemia, unspecified: Secondary | ICD-10-CM | POA: Diagnosis not present

## 2020-01-28 DIAGNOSIS — E1169 Type 2 diabetes mellitus with other specified complication: Secondary | ICD-10-CM | POA: Diagnosis not present

## 2020-01-28 DIAGNOSIS — Z79899 Other long term (current) drug therapy: Secondary | ICD-10-CM | POA: Diagnosis not present

## 2020-01-28 DIAGNOSIS — M791 Myalgia, unspecified site: Secondary | ICD-10-CM | POA: Diagnosis not present

## 2020-08-10 DIAGNOSIS — Z23 Encounter for immunization: Secondary | ICD-10-CM | POA: Diagnosis not present

## 2020-08-10 DIAGNOSIS — Z79899 Other long term (current) drug therapy: Secondary | ICD-10-CM | POA: Diagnosis not present

## 2020-08-10 DIAGNOSIS — E7800 Pure hypercholesterolemia, unspecified: Secondary | ICD-10-CM | POA: Diagnosis not present

## 2020-08-10 DIAGNOSIS — E1169 Type 2 diabetes mellitus with other specified complication: Secondary | ICD-10-CM | POA: Diagnosis not present

## 2020-08-10 DIAGNOSIS — Z0000 Encounter for general adult medical examination without abnormal findings: Secondary | ICD-10-CM | POA: Diagnosis not present

## 2020-08-10 DIAGNOSIS — M791 Myalgia, unspecified site: Secondary | ICD-10-CM | POA: Diagnosis not present

## 2020-08-10 DIAGNOSIS — E785 Hyperlipidemia, unspecified: Secondary | ICD-10-CM | POA: Diagnosis not present

## 2020-11-18 DIAGNOSIS — J329 Chronic sinusitis, unspecified: Secondary | ICD-10-CM | POA: Diagnosis not present

## 2020-11-18 DIAGNOSIS — J310 Chronic rhinitis: Secondary | ICD-10-CM | POA: Diagnosis not present

## 2021-02-02 DIAGNOSIS — G8929 Other chronic pain: Secondary | ICD-10-CM | POA: Diagnosis not present

## 2021-02-02 DIAGNOSIS — G720 Drug-induced myopathy: Secondary | ICD-10-CM | POA: Diagnosis not present

## 2021-02-02 DIAGNOSIS — E1169 Type 2 diabetes mellitus with other specified complication: Secondary | ICD-10-CM | POA: Diagnosis not present

## 2021-02-02 DIAGNOSIS — T466X5A Adverse effect of antihyperlipidemic and antiarteriosclerotic drugs, initial encounter: Secondary | ICD-10-CM | POA: Diagnosis not present

## 2021-02-02 DIAGNOSIS — M25561 Pain in right knee: Secondary | ICD-10-CM | POA: Diagnosis not present

## 2021-02-02 DIAGNOSIS — Z96651 Presence of right artificial knee joint: Secondary | ICD-10-CM | POA: Diagnosis not present

## 2021-02-02 DIAGNOSIS — E785 Hyperlipidemia, unspecified: Secondary | ICD-10-CM | POA: Diagnosis not present

## 2021-02-02 DIAGNOSIS — I10 Essential (primary) hypertension: Secondary | ICD-10-CM | POA: Diagnosis not present

## 2021-02-02 DIAGNOSIS — T8484XA Pain due to internal orthopedic prosthetic devices, implants and grafts, initial encounter: Secondary | ICD-10-CM | POA: Diagnosis not present

## 2021-02-02 DIAGNOSIS — Z79899 Other long term (current) drug therapy: Secondary | ICD-10-CM | POA: Diagnosis not present

## 2021-02-02 DIAGNOSIS — E7800 Pure hypercholesterolemia, unspecified: Secondary | ICD-10-CM | POA: Diagnosis not present

## 2021-04-20 DIAGNOSIS — C44319 Basal cell carcinoma of skin of other parts of face: Secondary | ICD-10-CM | POA: Diagnosis not present

## 2021-04-20 DIAGNOSIS — L821 Other seborrheic keratosis: Secondary | ICD-10-CM | POA: Diagnosis not present

## 2021-04-20 DIAGNOSIS — L570 Actinic keratosis: Secondary | ICD-10-CM | POA: Diagnosis not present

## 2021-05-26 DIAGNOSIS — R6889 Other general symptoms and signs: Secondary | ICD-10-CM | POA: Diagnosis not present

## 2021-08-10 DIAGNOSIS — R6889 Other general symptoms and signs: Secondary | ICD-10-CM | POA: Diagnosis not present

## 2021-08-13 DIAGNOSIS — G720 Drug-induced myopathy: Secondary | ICD-10-CM | POA: Diagnosis not present

## 2021-08-13 DIAGNOSIS — E7800 Pure hypercholesterolemia, unspecified: Secondary | ICD-10-CM | POA: Diagnosis not present

## 2021-08-13 DIAGNOSIS — E785 Hyperlipidemia, unspecified: Secondary | ICD-10-CM | POA: Diagnosis not present

## 2021-08-13 DIAGNOSIS — Z79899 Other long term (current) drug therapy: Secondary | ICD-10-CM | POA: Diagnosis not present

## 2021-08-13 DIAGNOSIS — M25561 Pain in right knee: Secondary | ICD-10-CM | POA: Diagnosis not present

## 2021-08-13 DIAGNOSIS — Z0000 Encounter for general adult medical examination without abnormal findings: Secondary | ICD-10-CM | POA: Diagnosis not present

## 2021-08-13 DIAGNOSIS — Z23 Encounter for immunization: Secondary | ICD-10-CM | POA: Diagnosis not present

## 2021-08-13 DIAGNOSIS — E1169 Type 2 diabetes mellitus with other specified complication: Secondary | ICD-10-CM | POA: Diagnosis not present

## 2021-08-13 DIAGNOSIS — T466X5A Adverse effect of antihyperlipidemic and antiarteriosclerotic drugs, initial encounter: Secondary | ICD-10-CM | POA: Diagnosis not present

## 2021-08-13 DIAGNOSIS — I10 Essential (primary) hypertension: Secondary | ICD-10-CM | POA: Diagnosis not present

## 2021-08-13 DIAGNOSIS — I6523 Occlusion and stenosis of bilateral carotid arteries: Secondary | ICD-10-CM | POA: Diagnosis not present

## 2021-08-19 DIAGNOSIS — I6523 Occlusion and stenosis of bilateral carotid arteries: Secondary | ICD-10-CM | POA: Diagnosis not present

## 2022-01-31 DIAGNOSIS — E7800 Pure hypercholesterolemia, unspecified: Secondary | ICD-10-CM | POA: Diagnosis not present

## 2022-01-31 DIAGNOSIS — E119 Type 2 diabetes mellitus without complications: Secondary | ICD-10-CM | POA: Diagnosis not present

## 2022-01-31 DIAGNOSIS — Z79899 Other long term (current) drug therapy: Secondary | ICD-10-CM | POA: Diagnosis not present

## 2022-02-03 DIAGNOSIS — T8484XA Pain due to internal orthopedic prosthetic devices, implants and grafts, initial encounter: Secondary | ICD-10-CM | POA: Diagnosis not present

## 2022-02-03 DIAGNOSIS — M25561 Pain in right knee: Secondary | ICD-10-CM | POA: Diagnosis not present

## 2022-02-03 DIAGNOSIS — E785 Hyperlipidemia, unspecified: Secondary | ICD-10-CM | POA: Diagnosis not present

## 2022-02-03 DIAGNOSIS — E7800 Pure hypercholesterolemia, unspecified: Secondary | ICD-10-CM | POA: Diagnosis not present

## 2022-02-03 DIAGNOSIS — G8929 Other chronic pain: Secondary | ICD-10-CM | POA: Diagnosis not present

## 2022-02-03 DIAGNOSIS — I10 Essential (primary) hypertension: Secondary | ICD-10-CM | POA: Diagnosis not present

## 2022-02-03 DIAGNOSIS — I6523 Occlusion and stenosis of bilateral carotid arteries: Secondary | ICD-10-CM | POA: Diagnosis not present

## 2022-02-03 DIAGNOSIS — G720 Drug-induced myopathy: Secondary | ICD-10-CM | POA: Diagnosis not present

## 2022-02-03 DIAGNOSIS — Z96651 Presence of right artificial knee joint: Secondary | ICD-10-CM | POA: Diagnosis not present

## 2022-02-03 DIAGNOSIS — T466X5A Adverse effect of antihyperlipidemic and antiarteriosclerotic drugs, initial encounter: Secondary | ICD-10-CM | POA: Diagnosis not present

## 2022-02-03 DIAGNOSIS — E1169 Type 2 diabetes mellitus with other specified complication: Secondary | ICD-10-CM | POA: Diagnosis not present

## 2022-04-19 DIAGNOSIS — L821 Other seborrheic keratosis: Secondary | ICD-10-CM | POA: Diagnosis not present

## 2022-04-19 DIAGNOSIS — Z85820 Personal history of malignant melanoma of skin: Secondary | ICD-10-CM | POA: Diagnosis not present

## 2022-04-19 DIAGNOSIS — L570 Actinic keratosis: Secondary | ICD-10-CM | POA: Diagnosis not present

## 2022-04-19 DIAGNOSIS — D045 Carcinoma in situ of skin of trunk: Secondary | ICD-10-CM | POA: Diagnosis not present

## 2022-04-19 DIAGNOSIS — L578 Other skin changes due to chronic exposure to nonionizing radiation: Secondary | ICD-10-CM | POA: Diagnosis not present

## 2022-07-14 DIAGNOSIS — E1169 Type 2 diabetes mellitus with other specified complication: Secondary | ICD-10-CM | POA: Diagnosis not present

## 2022-07-14 DIAGNOSIS — E7800 Pure hypercholesterolemia, unspecified: Secondary | ICD-10-CM | POA: Diagnosis not present

## 2022-07-14 DIAGNOSIS — B351 Tinea unguium: Secondary | ICD-10-CM | POA: Diagnosis not present

## 2022-08-15 DIAGNOSIS — E1169 Type 2 diabetes mellitus with other specified complication: Secondary | ICD-10-CM | POA: Diagnosis not present

## 2022-08-15 DIAGNOSIS — G720 Drug-induced myopathy: Secondary | ICD-10-CM | POA: Diagnosis not present

## 2022-08-15 DIAGNOSIS — M25561 Pain in right knee: Secondary | ICD-10-CM | POA: Diagnosis not present

## 2022-08-15 DIAGNOSIS — Z79899 Other long term (current) drug therapy: Secondary | ICD-10-CM | POA: Diagnosis not present

## 2022-08-15 DIAGNOSIS — T466X5A Adverse effect of antihyperlipidemic and antiarteriosclerotic drugs, initial encounter: Secondary | ICD-10-CM | POA: Diagnosis not present

## 2022-08-15 DIAGNOSIS — I10 Essential (primary) hypertension: Secondary | ICD-10-CM | POA: Diagnosis not present

## 2022-08-15 DIAGNOSIS — I6523 Occlusion and stenosis of bilateral carotid arteries: Secondary | ICD-10-CM | POA: Diagnosis not present

## 2022-08-15 DIAGNOSIS — E7800 Pure hypercholesterolemia, unspecified: Secondary | ICD-10-CM | POA: Diagnosis not present

## 2022-08-15 DIAGNOSIS — Z0000 Encounter for general adult medical examination without abnormal findings: Secondary | ICD-10-CM | POA: Diagnosis not present

## 2022-08-15 DIAGNOSIS — Z23 Encounter for immunization: Secondary | ICD-10-CM | POA: Diagnosis not present

## 2022-08-15 DIAGNOSIS — E785 Hyperlipidemia, unspecified: Secondary | ICD-10-CM | POA: Diagnosis not present

## 2022-09-28 DIAGNOSIS — C44619 Basal cell carcinoma of skin of left upper limb, including shoulder: Secondary | ICD-10-CM | POA: Diagnosis not present

## 2022-09-28 DIAGNOSIS — C44629 Squamous cell carcinoma of skin of left upper limb, including shoulder: Secondary | ICD-10-CM | POA: Diagnosis not present

## 2022-09-28 DIAGNOSIS — L814 Other melanin hyperpigmentation: Secondary | ICD-10-CM | POA: Diagnosis not present

## 2022-09-28 DIAGNOSIS — L578 Other skin changes due to chronic exposure to nonionizing radiation: Secondary | ICD-10-CM | POA: Diagnosis not present

## 2022-09-28 DIAGNOSIS — L570 Actinic keratosis: Secondary | ICD-10-CM | POA: Diagnosis not present

## 2023-01-03 DIAGNOSIS — Z0100 Encounter for examination of eyes and vision without abnormal findings: Secondary | ICD-10-CM | POA: Diagnosis not present

## 2023-01-03 DIAGNOSIS — E119 Type 2 diabetes mellitus without complications: Secondary | ICD-10-CM | POA: Diagnosis not present

## 2023-01-03 DIAGNOSIS — Z7984 Long term (current) use of oral hypoglycemic drugs: Secondary | ICD-10-CM | POA: Diagnosis not present

## 2023-01-03 DIAGNOSIS — H52223 Regular astigmatism, bilateral: Secondary | ICD-10-CM | POA: Diagnosis not present

## 2023-01-03 DIAGNOSIS — H524 Presbyopia: Secondary | ICD-10-CM | POA: Diagnosis not present

## 2023-01-03 DIAGNOSIS — H25813 Combined forms of age-related cataract, bilateral: Secondary | ICD-10-CM | POA: Diagnosis not present

## 2023-01-03 DIAGNOSIS — H5203 Hypermetropia, bilateral: Secondary | ICD-10-CM | POA: Diagnosis not present

## 2023-01-04 DIAGNOSIS — Z85820 Personal history of malignant melanoma of skin: Secondary | ICD-10-CM | POA: Diagnosis not present

## 2023-01-04 DIAGNOSIS — L821 Other seborrheic keratosis: Secondary | ICD-10-CM | POA: Diagnosis not present

## 2023-01-04 DIAGNOSIS — L570 Actinic keratosis: Secondary | ICD-10-CM | POA: Diagnosis not present

## 2023-01-04 DIAGNOSIS — D225 Melanocytic nevi of trunk: Secondary | ICD-10-CM | POA: Diagnosis not present

## 2023-01-04 DIAGNOSIS — L578 Other skin changes due to chronic exposure to nonionizing radiation: Secondary | ICD-10-CM | POA: Diagnosis not present

## 2023-02-22 DIAGNOSIS — E7800 Pure hypercholesterolemia, unspecified: Secondary | ICD-10-CM | POA: Diagnosis not present

## 2023-02-22 DIAGNOSIS — Z79899 Other long term (current) drug therapy: Secondary | ICD-10-CM | POA: Diagnosis not present

## 2023-02-22 DIAGNOSIS — Z96651 Presence of right artificial knee joint: Secondary | ICD-10-CM | POA: Diagnosis not present

## 2023-02-22 DIAGNOSIS — G8929 Other chronic pain: Secondary | ICD-10-CM | POA: Diagnosis not present

## 2023-02-22 DIAGNOSIS — G720 Drug-induced myopathy: Secondary | ICD-10-CM | POA: Diagnosis not present

## 2023-02-22 DIAGNOSIS — I10 Essential (primary) hypertension: Secondary | ICD-10-CM | POA: Diagnosis not present

## 2023-02-22 DIAGNOSIS — E785 Hyperlipidemia, unspecified: Secondary | ICD-10-CM | POA: Diagnosis not present

## 2023-02-22 DIAGNOSIS — T8484XA Pain due to internal orthopedic prosthetic devices, implants and grafts, initial encounter: Secondary | ICD-10-CM | POA: Diagnosis not present

## 2023-02-22 DIAGNOSIS — Z0000 Encounter for general adult medical examination without abnormal findings: Secondary | ICD-10-CM | POA: Diagnosis not present

## 2023-02-22 DIAGNOSIS — E1169 Type 2 diabetes mellitus with other specified complication: Secondary | ICD-10-CM | POA: Diagnosis not present

## 2023-02-22 DIAGNOSIS — M1712 Unilateral primary osteoarthritis, left knee: Secondary | ICD-10-CM | POA: Diagnosis not present

## 2023-02-22 DIAGNOSIS — M25561 Pain in right knee: Secondary | ICD-10-CM | POA: Diagnosis not present

## 2023-03-06 DIAGNOSIS — E1169 Type 2 diabetes mellitus with other specified complication: Secondary | ICD-10-CM | POA: Diagnosis not present

## 2023-04-11 DIAGNOSIS — L578 Other skin changes due to chronic exposure to nonionizing radiation: Secondary | ICD-10-CM | POA: Diagnosis not present

## 2023-04-11 DIAGNOSIS — C44629 Squamous cell carcinoma of skin of left upper limb, including shoulder: Secondary | ICD-10-CM | POA: Diagnosis not present

## 2023-04-11 DIAGNOSIS — L570 Actinic keratosis: Secondary | ICD-10-CM | POA: Diagnosis not present

## 2023-04-11 DIAGNOSIS — Z85820 Personal history of malignant melanoma of skin: Secondary | ICD-10-CM | POA: Diagnosis not present

## 2023-08-17 DIAGNOSIS — E1169 Type 2 diabetes mellitus with other specified complication: Secondary | ICD-10-CM | POA: Diagnosis not present

## 2023-08-21 DIAGNOSIS — E7800 Pure hypercholesterolemia, unspecified: Secondary | ICD-10-CM | POA: Diagnosis not present

## 2023-08-21 DIAGNOSIS — M1712 Unilateral primary osteoarthritis, left knee: Secondary | ICD-10-CM | POA: Diagnosis not present

## 2023-08-21 DIAGNOSIS — G8929 Other chronic pain: Secondary | ICD-10-CM | POA: Diagnosis not present

## 2023-08-21 DIAGNOSIS — T8484XA Pain due to internal orthopedic prosthetic devices, implants and grafts, initial encounter: Secondary | ICD-10-CM | POA: Diagnosis not present

## 2023-08-21 DIAGNOSIS — E1169 Type 2 diabetes mellitus with other specified complication: Secondary | ICD-10-CM | POA: Diagnosis not present

## 2023-08-21 DIAGNOSIS — M25561 Pain in right knee: Secondary | ICD-10-CM | POA: Diagnosis not present

## 2023-12-11 DIAGNOSIS — H903 Sensorineural hearing loss, bilateral: Secondary | ICD-10-CM | POA: Diagnosis not present

## 2023-12-14 DIAGNOSIS — H905 Unspecified sensorineural hearing loss: Secondary | ICD-10-CM | POA: Diagnosis not present

## 2024-01-30 DIAGNOSIS — L821 Other seborrheic keratosis: Secondary | ICD-10-CM | POA: Diagnosis not present

## 2024-01-30 DIAGNOSIS — L570 Actinic keratosis: Secondary | ICD-10-CM | POA: Diagnosis not present

## 2024-01-30 DIAGNOSIS — Z85820 Personal history of malignant melanoma of skin: Secondary | ICD-10-CM | POA: Diagnosis not present

## 2024-01-30 DIAGNOSIS — L578 Other skin changes due to chronic exposure to nonionizing radiation: Secondary | ICD-10-CM | POA: Diagnosis not present

## 2024-02-08 DIAGNOSIS — E785 Hyperlipidemia, unspecified: Secondary | ICD-10-CM | POA: Diagnosis not present

## 2024-02-08 DIAGNOSIS — E1169 Type 2 diabetes mellitus with other specified complication: Secondary | ICD-10-CM | POA: Diagnosis not present

## 2024-02-08 DIAGNOSIS — E7800 Pure hypercholesterolemia, unspecified: Secondary | ICD-10-CM | POA: Diagnosis not present

## 2024-02-12 DIAGNOSIS — M1712 Unilateral primary osteoarthritis, left knee: Secondary | ICD-10-CM | POA: Diagnosis not present

## 2024-02-12 DIAGNOSIS — Z6824 Body mass index (BMI) 24.0-24.9, adult: Secondary | ICD-10-CM | POA: Diagnosis not present

## 2024-02-26 DIAGNOSIS — Z0000 Encounter for general adult medical examination without abnormal findings: Secondary | ICD-10-CM | POA: Diagnosis not present

## 2024-02-26 DIAGNOSIS — T8484XA Pain due to internal orthopedic prosthetic devices, implants and grafts, initial encounter: Secondary | ICD-10-CM | POA: Diagnosis not present

## 2024-02-26 DIAGNOSIS — M25561 Pain in right knee: Secondary | ICD-10-CM | POA: Diagnosis not present

## 2024-02-26 DIAGNOSIS — Z6823 Body mass index (BMI) 23.0-23.9, adult: Secondary | ICD-10-CM | POA: Diagnosis not present

## 2024-02-26 DIAGNOSIS — E785 Hyperlipidemia, unspecified: Secondary | ICD-10-CM | POA: Diagnosis not present

## 2024-02-26 DIAGNOSIS — E1169 Type 2 diabetes mellitus with other specified complication: Secondary | ICD-10-CM | POA: Diagnosis not present

## 2024-02-26 DIAGNOSIS — G720 Drug-induced myopathy: Secondary | ICD-10-CM | POA: Diagnosis not present

## 2024-02-26 DIAGNOSIS — M1712 Unilateral primary osteoarthritis, left knee: Secondary | ICD-10-CM | POA: Diagnosis not present

## 2024-02-26 DIAGNOSIS — I10 Essential (primary) hypertension: Secondary | ICD-10-CM | POA: Diagnosis not present

## 2024-02-26 DIAGNOSIS — E7800 Pure hypercholesterolemia, unspecified: Secondary | ICD-10-CM | POA: Diagnosis not present

## 2024-02-26 DIAGNOSIS — G8929 Other chronic pain: Secondary | ICD-10-CM | POA: Diagnosis not present

## 2024-02-26 DIAGNOSIS — T466X5A Adverse effect of antihyperlipidemic and antiarteriosclerotic drugs, initial encounter: Secondary | ICD-10-CM | POA: Diagnosis not present

## 2024-08-09 DIAGNOSIS — E1169 Type 2 diabetes mellitus with other specified complication: Secondary | ICD-10-CM | POA: Diagnosis not present

## 2024-08-09 DIAGNOSIS — E785 Hyperlipidemia, unspecified: Secondary | ICD-10-CM | POA: Diagnosis not present

## 2024-08-09 DIAGNOSIS — E7800 Pure hypercholesterolemia, unspecified: Secondary | ICD-10-CM | POA: Diagnosis not present

## 2024-08-12 DIAGNOSIS — M1712 Unilateral primary osteoarthritis, left knee: Secondary | ICD-10-CM | POA: Diagnosis not present

## 2024-08-19 DIAGNOSIS — Z23 Encounter for immunization: Secondary | ICD-10-CM | POA: Diagnosis not present

## 2024-08-19 DIAGNOSIS — E7800 Pure hypercholesterolemia, unspecified: Secondary | ICD-10-CM | POA: Diagnosis not present

## 2024-08-19 DIAGNOSIS — I6523 Occlusion and stenosis of bilateral carotid arteries: Secondary | ICD-10-CM | POA: Diagnosis not present

## 2024-08-19 DIAGNOSIS — Z0000 Encounter for general adult medical examination without abnormal findings: Secondary | ICD-10-CM | POA: Diagnosis not present

## 2024-08-19 DIAGNOSIS — E1169 Type 2 diabetes mellitus with other specified complication: Secondary | ICD-10-CM | POA: Diagnosis not present

## 2024-08-19 DIAGNOSIS — I10 Essential (primary) hypertension: Secondary | ICD-10-CM | POA: Diagnosis not present

## 2024-08-19 DIAGNOSIS — G720 Drug-induced myopathy: Secondary | ICD-10-CM | POA: Diagnosis not present

## 2024-08-19 DIAGNOSIS — Z9181 History of falling: Secondary | ICD-10-CM | POA: Diagnosis not present

## 2024-08-19 DIAGNOSIS — T466X5A Adverse effect of antihyperlipidemic and antiarteriosclerotic drugs, initial encounter: Secondary | ICD-10-CM | POA: Diagnosis not present

## 2024-08-19 DIAGNOSIS — E785 Hyperlipidemia, unspecified: Secondary | ICD-10-CM | POA: Diagnosis not present
